# Patient Record
Sex: Female | Born: 1980
Health system: Southern US, Community
[De-identification: ages and names within clinical notes are randomized; demographics above are authoritative.]

## PROBLEM LIST (undated history)

## (undated) DIAGNOSIS — F419 Anxiety disorder, unspecified: Secondary | ICD-10-CM

## (undated) DIAGNOSIS — F329 Major depressive disorder, single episode, unspecified: Secondary | ICD-10-CM

## (undated) DIAGNOSIS — F32A Depression, unspecified: Secondary | ICD-10-CM

## (undated) HISTORY — DX: Anxiety disorder, unspecified: F41.9

## (undated) HISTORY — DX: Major depressive disorder, single episode, unspecified: F32.9

## (undated) HISTORY — DX: Depression, unspecified: F32.A

---

## 2004-07-22 ENCOUNTER — Ambulatory Visit (HOSPITAL_COMMUNITY): Admission: RE | Admit: 2004-07-22 | Discharge: 2004-07-22 | Payer: Self-pay | Admitting: *Deleted

## 2004-12-30 ENCOUNTER — Ambulatory Visit: Payer: Self-pay | Admitting: *Deleted

## 2005-01-02 ENCOUNTER — Ambulatory Visit: Payer: Self-pay | Admitting: *Deleted

## 2005-01-02 ENCOUNTER — Inpatient Hospital Stay (HOSPITAL_COMMUNITY): Admission: AD | Admit: 2005-01-02 | Discharge: 2005-01-04 | Payer: Self-pay | Admitting: *Deleted

## 2006-12-31 ENCOUNTER — Ambulatory Visit (HOSPITAL_COMMUNITY): Admission: RE | Admit: 2006-12-31 | Discharge: 2006-12-31 | Payer: Self-pay | Admitting: Family Medicine

## 2007-05-10 ENCOUNTER — Ambulatory Visit: Payer: Self-pay | Admitting: Family

## 2007-05-10 ENCOUNTER — Inpatient Hospital Stay (HOSPITAL_COMMUNITY): Admission: AD | Admit: 2007-05-10 | Discharge: 2007-05-12 | Payer: Self-pay | Admitting: Obstetrics and Gynecology

## 2008-03-27 ENCOUNTER — Ambulatory Visit (HOSPITAL_COMMUNITY): Admission: RE | Admit: 2008-03-27 | Discharge: 2008-03-27 | Payer: Self-pay | Admitting: Obstetrics & Gynecology

## 2008-06-21 ENCOUNTER — Inpatient Hospital Stay (HOSPITAL_COMMUNITY): Admission: AD | Admit: 2008-06-21 | Discharge: 2008-06-22 | Payer: Self-pay | Admitting: Family Medicine

## 2008-06-21 ENCOUNTER — Ambulatory Visit: Payer: Self-pay | Admitting: Obstetrics and Gynecology

## 2010-11-21 LAB — CBC
HCT: 39.8
MCV: 91.3
RDW: 13.1
WBC: 14.5 — ABNORMAL HIGH

## 2010-11-21 LAB — RPR: RPR Ser Ql: NONREACTIVE

## 2012-11-06 ENCOUNTER — Encounter: Payer: Self-pay | Admitting: Internal Medicine

## 2012-11-06 ENCOUNTER — Ambulatory Visit: Payer: No Typology Code available for payment source | Attending: Internal Medicine | Admitting: Internal Medicine

## 2012-11-06 VITALS — BP 110/65 | HR 78 | Temp 98.7°F | Resp 16 | Ht <= 58 in | Wt 125.0 lb

## 2012-11-06 DIAGNOSIS — K2990 Gastroduodenitis, unspecified, without bleeding: Secondary | ICD-10-CM

## 2012-11-06 DIAGNOSIS — K2970 Gastritis, unspecified, without bleeding: Secondary | ICD-10-CM

## 2012-11-06 DIAGNOSIS — K219 Gastro-esophageal reflux disease without esophagitis: Secondary | ICD-10-CM | POA: Insufficient documentation

## 2012-11-06 LAB — CBC WITH DIFFERENTIAL/PLATELET
Basophils Absolute: 0 10*3/uL (ref 0.0–0.1)
Basophils Relative: 0 % (ref 0–1)
Eosinophils Absolute: 0.1 10*3/uL (ref 0.0–0.7)
Eosinophils Relative: 1 % (ref 0–5)
HCT: 38.6 % (ref 36.0–46.0)
Hemoglobin: 13.5 g/dL (ref 12.0–15.0)
MCHC: 35 g/dL (ref 30.0–36.0)
Neutro Abs: 3.4 10*3/uL (ref 1.7–7.7)
RDW: 13.3 % (ref 11.5–15.5)
WBC: 5.4 10*3/uL (ref 4.0–10.5)

## 2012-11-06 LAB — CMP AND LIVER
Albumin: 4.3 g/dL (ref 3.5–5.2)
Bilirubin, Direct: 0.2 mg/dL (ref 0.0–0.3)
Chloride: 104 mEq/L (ref 96–112)
Indirect Bilirubin: 0.8 mg/dL (ref 0.0–0.9)
Potassium: 4.1 mEq/L (ref 3.5–5.3)
Total Bilirubin: 1 mg/dL (ref 0.3–1.2)
Total Protein: 7.4 g/dL (ref 6.0–8.3)

## 2012-11-06 LAB — LIPID PANEL
Cholesterol: 184 mg/dL (ref 0–200)
LDL Cholesterol: 112 mg/dL — ABNORMAL HIGH (ref 0–99)
Triglycerides: 144 mg/dL (ref <150)
VLDL: 29 mg/dL (ref 0–40)

## 2012-11-06 MED ORDER — ESOMEPRAZOLE MAGNESIUM 40 MG PO CPDR: 40.0000 mg | DELAYED_RELEASE_CAPSULE | Freq: Every day | ORAL | Status: DC

## 2012-11-06 NOTE — Progress Notes (Signed)
PT HERE TO ESTABLISH CARE C/O ACID REFLUX AFTER EATING X 1 MNTH NO MED HX NOTED

## 2012-11-06 NOTE — Patient Instructions (Signed)
Gastritis - Adultos   (Gastritis, Adult)   La gastrittis es la irritación (inflamación) de la membrana interna del estómago. Puede ser una enfermedad de inicio súbito (aguda) o de largo plazo (crónica). Si la gastritis no se trata, puede causar sangrado y úlceras.  CAUSAS   La gastritis se produce cuando la membrana que tapiza interiormente al estómago se debilita o se daña. Los jugos digestivos del estómago inflaman el revestimiento del estómago debilitado. El revestimiento del estómago puede debilitarse o dañarse por una infección viral o bacteriana. La infección bacteriana más común es la infección por Helicobacter pylori. También puede ser el resultado del consumo excesivo de alcohol, por el uso de ciertos medicamentos o porque hay demasiado ácido en el estómago.   SÍNTOMAS   En algunos casos no hay síntomas. Si se presentan síntomas, éstos pueden ser:   · Dolor o sensación de ardor en la parte superior del abdomen.  · Náuseas.  · Vómitos.  · Sensación molesta de distensión después de comer.  DIAGNÓSTICO   El médico puede diagnosticar gastritis según los síntomas y el examen físico. Para determinar la causa de la gastritis, el médico podrá:   · Pedir análisis de sangre o de materia fecal para diagnosticar la presencia de la bacteria H pylori.  · Gastroscopía. Un tubo delgado y flexible (endoscopio) se pasa por el esófago hasta llegar al estómago. El endoscopio tiene una luz y una cámara en el extremo. El médico utilizará el endoscopio para observar el interior del estómago.  · Tomará una muestra de tejido (biopsia) del estómago para examinarlo en el microscopio.  TRATAMIENTO   Según la causa de la gastritis podrán recetarle: Antibióticos, si la causa es una infección bacteriana, como una infección por H. pylori. Antiácidos o bloqueadores H2, si hay demasiado ácido en el estómago. El médico le aconsejará que deje de tomar aspirina, ibuprofeno u otros antiinflamatorios no esteroides (AINE).   INSTRUCCIONES PARA EL  CUIDADO EN EL HOGAR   · Tome sólo medicamentos de venta libre o recetados, según las indicaciones del médico.  · Si le han recetado antibióticos, tómelos según las indicaciones. Tómelos todos, aunque se sienta mejor.  · Debe ingerir gran cantidad de líquido para mantener la orina de tono claro o color amarillo pálido.  · Evite las comidas y bebidas que empeoran los problemas, como:  · Bebidas con cafeína o alcohólicas.  · Chocolate.  · Sabores a menta.  · Ajo y cebolla.  · Comidas muy condimentadas.  · Cítricos como naranjas, limones o limas.  · Alimentos que contengan tomate, como salsas, chile y pizza.  · Alimentos fritos y grasos.  · Haga comidas pequeñas durante el día en lugar de 3 comidas abundantes.  SOLICITE ATENCIÓN MÉDICA DE INMEDIATO SI:   · La materia fecal es negra o de color rojo oscuro.  · Vomita sangre de color rojo brillante o material similar a granos de café.  · No puede retener los líquidos.  · El dolor abdominal empeora.  · Tiene fiebre.  · No mejora luego de 1 semana.  · Tiene preguntas o preocupaciones.  ASEGÚRESE DE QUE:   · Comprende estas instrucciones.  · Controlará su enfermedad.  · Solicitará ayuda de inmediato si no mejora o si empeora.  Document Released: 11/23/2004 Document Revised: 11/08/2011  Exit[employer]® Patient Information ©2014 [employer].

## 2012-11-06 NOTE — Progress Notes (Signed)
Patient ID: Tonda Wiederhold, female   DOB: [birth year], 32 y.o.   MRN: [id number] Patient Demographics  Sheyanne Munley, is a 32 y.o. female  WUJ:[id number]  [id number]  DOB - May 26, 1980  CC:  Chief Complaint  Patient presents with  . Establish Care  . Gastrophageal Reflux       HPI: Brennley Curtice today is here to establish medical care. She is a 32 year old woman with no significant past medical history. Her major concern today is easy satiety. She complains that she feels full very easily after food no matter how little she eats, sometimes she feels full she does not want to eat at all. She has been told before about gastritis but not on any medication. She is married with 4 children, last born is 36 years old. Denies any stressors at home. She does not smoke cigarette, she does not drink alcohol. Lately her menstrual period has also been irregular, but she was on Depo-Provera before and she stopped.  Patient has No headache, No chest pain,- No Nausea or vomiting, No new weakness tingling or numbness, No Cough - SOB.  No Known Allergies History reviewed. No pertinent past medical history. No current outpatient prescriptions on file prior to visit.   No current facility-administered medications on file prior to visit.   Family History  Problem Relation Age of Onset  . Diabetes Mother    History   Social History  . Marital Status: Single    Spouse Name: N/A    Number of Children: N/A  . Years of Education: N/A   Occupational History  . Not on file.   Social History Main Topics  . Smoking status: Never Smoker   . Smokeless tobacco: Not on file  . Alcohol Use: No  . Drug Use: No  . Sexual Activity: Not on file   Other Topics Concern  . Not on file   Social History Narrative  . No narrative on file    Review of Systems: Constitutional: Negative for fever, chills, diaphoresis, activity change, appetite change and fatigue. HENT: Negative for ear pain,  nosebleeds, congestion, facial swelling, rhinorrhea, neck pain, neck stiffness and ear discharge.  Eyes: Negative for pain, discharge, redness, itching and visual disturbance. Respiratory: Negative for cough, choking, chest tightness, shortness of breath, wheezing and stridor.  Cardiovascular: Negative for chest pain, palpitations and leg swelling.. Genitourinary: Negative for dysuria, urgency, frequency, hematuria, flank pain, decreased urine volume, difficulty urinating and dyspareunia.  Musculoskeletal: Negative for back pain, joint swelling, arthralgia and gait problem. Neurological: Negative for dizziness, tremors, seizures, syncope, facial asymmetry, speech difficulty, weakness, light-headedness, numbness and headaches.  Hematological: Negative for adenopathy. Does not bruise/bleed easily. Psychiatric/Behavioral: Negative for hallucinations, behavioral problems, confusion, dysphoric mood, decreased concentration and agitation.    Objective:   Filed Vitals:   11/06/12 1022  BP: 110/65  Pulse: 78  Temp: 98.7 F (37.1 C)  Resp: 16    Physical Exam: Constitutional: Patient appears well-developed and well-nourished. No distress. HENT: Normocephalic, atraumatic, External right and left ear normal. Oropharynx is clear and moist.  Eyes: Conjunctivae and EOM are normal. PERRLA, no scleral icterus. Neck: Normal ROM. Neck supple. No JVD. No tracheal deviation. No thyromegaly. CVS: RRR, S1/S2 +, no murmurs, no gallops, no carotid bruit.  Pulmonary: Effort and breath sounds normal, no stridor, rhonchi, wheezes, rales.  Abdominal: Soft. BS +, no distension, mild epigastric tenderness, no rebound or guarding.  Musculoskeletal: Normal range of motion. No edema and no tenderness.  Lymphadenopathy: No lymphadenopathy noted,  cervical, inguinal or axillary Neuro: Alert. Normal reflexes, muscle tone coordination. No cranial nerve deficit. Skin: Skin is warm and dry. No rash noted. Not diaphoretic.  No erythema. No pallor. Psychiatric: Normal mood and affect. Behavior, judgment, thought content normal.  Lab Results  Component Value Date   WBC 14.5* 05/10/2007   HGB 13.8 05/10/2007   HCT 39.8 05/10/2007   MCV 91.3 05/10/2007   PLT 208 05/10/2007   No results found for this basename: CREATININE, BUN, NA, K, CL, CO2    No results found for this basename: HGBA1C   Lipid Panel  No results found for this basename: chol, trig, hdl, cholhdl, vldl, ldlcalc       Assessment and plan:   There are no active problems to display for this patient.   Plan: Nexium 40 mg capsule by mouth daily Patient extensively counseled about reflux and gastritis Frequent meal small quantity at the time Adequate liquid intake Patient counseled about nutrition and exercise  Labs: CBC D. CMP Lipid panel Urinalysis     Health Maintenance** -Colonoscopy: -Pap Smear: -Mammogram: -Vaccinations:  -TdAP  -PNA (PPSV23) (one dose after 65) (or one dose before 65 if chronic conditions)  -Zoster (1 dose after 60 yrs)  -Influenza  Follow up in 2 months  The patient was given clear instructions to go to ER or return to medical center if symptoms don't improve, worsen or new problems develop. The patient verbalized understanding. The patient was told to call to get lab results if they haven't heard anything in the next week.   Interpreter was used to communicate directly with patient for the entire encounter including providing detailed patient instructions.   Jeanann Lewandowsky, MD, MHA, FACP [facility] [place], [place] [phone]   11/06/2012, 11:05 AM

## 2012-11-07 ENCOUNTER — Telehealth: Payer: Self-pay | Admitting: Emergency Medicine

## 2012-11-07 LAB — URINALYSIS, COMPLETE
Bacteria, UA: NONE SEEN
Casts: NONE SEEN
Hgb urine dipstick: NEGATIVE
Leukocytes, UA: NEGATIVE
Nitrite: NEGATIVE
Specific Gravity, Urine: 1.011 (ref 1.005–1.030)
pH: 7.5 (ref 5.0–8.0)

## 2012-11-07 NOTE — Telephone Encounter (Signed)
Message copied by Darlis Loan on Thu Nov 07, 2012  4:40 PM ------      Message from: Jeanann Lewandowsky E      Created: Thu Nov 07, 2012  9:32 AM       Please call to inform patient that most of the lab results come back normal. Cholesterol is slightly high but can be corrected with nutrition control and exercise ------

## 2012-11-07 NOTE — Telephone Encounter (Signed)
LAB RESULTS GIVEN  

## 2013-01-06 ENCOUNTER — Ambulatory Visit: Payer: Self-pay

## 2013-02-07 ENCOUNTER — Emergency Department (HOSPITAL_COMMUNITY)
Admission: EM | Admit: 2013-02-07 | Discharge: 2013-02-08 | Disposition: A | Discharge status: Home / Self Care | Payer: No Typology Code available for payment source | Attending: Emergency Medicine | Admitting: Emergency Medicine

## 2013-02-07 ENCOUNTER — Encounter (HOSPITAL_COMMUNITY): Payer: Self-pay | Admitting: Emergency Medicine

## 2013-02-07 DIAGNOSIS — F419 Anxiety disorder, unspecified: Secondary | ICD-10-CM

## 2013-02-07 DIAGNOSIS — F411 Generalized anxiety disorder: Secondary | ICD-10-CM | POA: Insufficient documentation

## 2013-02-07 DIAGNOSIS — Z79899 Other long term (current) drug therapy: Secondary | ICD-10-CM | POA: Insufficient documentation

## 2013-02-07 NOTE — ED Provider Notes (Signed)
CSN: [id number]     Arrival date & time 02/07/13  2248 History   First MD Initiated Contact with Patient 02/07/13 2322     Chief Complaint  Patient presents with  . Hypertension    HPI  History provided by the patient through a Spanish interpreter. Patient is a 32 year old female with no significant PMH presenting with concerns for general discomfort and elevated blood pressure. Patient states that she has had some recent episodes of feeling "desperate". Symptoms are associated with slight lightheadedness feeling and feeling uneasy. She does report having increased stress. No prior history of anxiety or panic attacks. Denies any hyperventilation or shortness of breath during these episodes. She did have a friend who is nursing a took her blood pressure and reported that it was high at 125 systolic. Patient was generally concerned and wanted to be sure nothing serious was going on. She does feel better currently. Episodes have been lasting 10 minutes or less. Denies any associated heart palpitations or chest pain. No recent travel. No swelling or pain in the extremities. No previous history of DVT or PE. No cough or hemoptysis.    History reviewed. No pertinent past medical history. History reviewed. No pertinent past surgical history. Family History  Problem Relation Age of Onset  . Diabetes Mother    History  Substance Use Topics  . Smoking status: Never Smoker   . Smokeless tobacco: Not on file  . Alcohol Use: Yes   OB History   Grav Para Term Preterm Abortions TAB SAB Ect Mult Living                 Review of Systems  Constitutional: Negative for fever and chills.  Respiratory: Negative for cough and shortness of breath.   Cardiovascular: Negative for chest pain, palpitations and leg swelling.  All other systems reviewed and are negative.    Allergies  Review of patient's allergies indicates no known allergies.  Home Medications   Current Outpatient Rx  Name  Route   Sig  Dispense  Refill  . esomeprazole (NEXIUM) 40 MG capsule   Oral   Take 1 capsule (40 mg total) by mouth daily.   30 capsule   3    BP 120/72  Pulse 84  Temp(Src) 98.2 F (36.8 C) (Oral)  Resp 16  Wt 128 lb (58.06 kg)  SpO2 98%  LMP 01/16/2013 Physical Exam  Nursing note and vitals reviewed. Constitutional: She is oriented to person, place, and time. She appears well-developed and well-nourished. No distress.  HENT:  Head: Normocephalic.  Eyes: Conjunctivae are normal.  Neck: Normal range of motion. Neck supple.  Cardiovascular: Normal rate and regular rhythm.   No murmur heard. Pulmonary/Chest: Effort normal and breath sounds normal. No respiratory distress.  Abdominal: Soft.  Musculoskeletal: Normal range of motion. She exhibits no edema and no tenderness.  Neurological: She is alert and oriented to person, place, and time.  Skin: Skin is warm and dry. No rash noted.  Psychiatric: She has a normal mood and affect. Her behavior is normal.    ED Course  Procedures   DIAGNOSTIC STUDIES: Oxygen Saturation is 98% on room air.    COORDINATION OF CARE:  Nursing notes reviewed. Vital signs reviewed. Initial pt interview and examination performed.   11:59 PM- patient seen and evaluated. Patient well appearing. Does not appear in any acute distress. Normal respirations. Pt is PERC negative. At this time doubt any concerning or emergent condition causing patient's symptoms. She is  not significantly hypertensive. Patient instructed to followup with her primary care provider for continued evaluation and treatment. Pt agrees with plan.    MDM   1. Anxiety        Angus Seller, PA-C 02/08/13 2155

## 2013-02-07 NOTE — ED Notes (Signed)
Pt reports elevated BP at home (125/?).  Denies any complaints.  Asked pt if she meant her HR and she said no that her BP was elevated at 5pm when she checked it at home.

## 2013-02-09 NOTE — ED Provider Notes (Signed)
Medical screening examination/treatment/procedure(s) were performed by non-physician practitioner and as supervising physician I was immediately available for consultation/collaboration.    Mattias Walmsley, MD 02/09/13 0102 

## 2013-03-04 ENCOUNTER — Encounter: Payer: Self-pay | Admitting: Internal Medicine

## 2013-03-04 ENCOUNTER — Ambulatory Visit: Payer: No Typology Code available for payment source | Attending: Internal Medicine | Admitting: Internal Medicine

## 2013-03-04 VITALS — BP 111/71 | HR 88 | Temp 97.9°F | Resp 16 | Wt 128.0 lb

## 2013-03-04 DIAGNOSIS — F419 Anxiety disorder, unspecified: Secondary | ICD-10-CM

## 2013-03-04 DIAGNOSIS — F329 Major depressive disorder, single episode, unspecified: Secondary | ICD-10-CM | POA: Insufficient documentation

## 2013-03-04 DIAGNOSIS — F341 Dysthymic disorder: Secondary | ICD-10-CM

## 2013-03-04 DIAGNOSIS — F411 Generalized anxiety disorder: Secondary | ICD-10-CM | POA: Insufficient documentation

## 2013-03-04 DIAGNOSIS — F3289 Other specified depressive episodes: Secondary | ICD-10-CM | POA: Insufficient documentation

## 2013-03-04 MED ORDER — SERTRALINE HCL 25 MG PO TABS: 25.0000 mg | ORAL_TABLET | Freq: Every day | ORAL | Status: DC

## 2013-03-04 NOTE — Progress Notes (Signed)
MRN: [id number] Name: Amy Tyler  Sex: female Age: 33 y.o. DOB: [birth year]  Allergies: Review of patient's allergies indicates no known allergies.  Chief Complaint  Patient presents with  . Medication Refill    HPI: Patient is 33 y.o. female who comes today reported to have symptoms of anxiety/depression, she brought the bottle of medication lorazepam which was prescribed to her by a physician, she reports change in sleep appetite, I have reviewed her previous blood work denies any SI or HI.  No past medical history on file.  No past surgical history on file.    Medication List       This list is accurate as of: 03/04/13  9:37 AM.  Always use your most recent med list.               esomeprazole 40 MG capsule  Commonly known as:  NEXIUM  Take 1 capsule (40 mg total) by mouth daily.     sertraline 25 MG tablet  Commonly known as:  ZOLOFT  Take 1 tablet (25 mg total) by mouth daily.        Meds ordered this encounter  Medications  . sertraline (ZOLOFT) 25 MG tablet    Sig: Take 1 tablet (25 mg total) by mouth daily.    Dispense:  30 tablet    Refill:  3     There is no immunization history on file for this patient.  Family History  Problem Relation Age of Onset  . Diabetes Mother     History  Substance Use Topics  . Smoking status: Never Smoker   . Smokeless tobacco: Not on file  . Alcohol Use: Yes    Review of Systems  As noted in HPI  Filed Vitals:   03/04/13 0926  BP: 111/71  Pulse: 88  Temp: 97.9 F (36.6 C)  Resp: 16    Physical Exam  Physical Exam  Constitutional: No distress.  Eyes: EOM are normal. Pupils are equal, round, and reactive to light.  Cardiovascular: Normal rate and regular rhythm.   Pulmonary/Chest: Breath sounds normal. No respiratory distress. She has no wheezes. She has no rales.    CBC    Component Value Date/Time   WBC 5.4 11/06/2012 1105   RBC 4.46 11/06/2012 1105   HGB 13.5 11/06/2012 1105   HCT 38.6  11/06/2012 1105   PLT 299 11/06/2012 1105   MCV 86.5 11/06/2012 1105   LYMPHSABS 1.6 11/06/2012 1105   MONOABS 0.3 11/06/2012 1105   EOSABS 0.1 11/06/2012 1105   BASOSABS 0.0 11/06/2012 1105    CMP     Component Value Date/Time   NA 138 11/06/2012 1105   K 4.1 11/06/2012 1105   CL 104 11/06/2012 1105   CO2 27 11/06/2012 1105   GLUCOSE 82 11/06/2012 1105   BUN 8 11/06/2012 1105   CREATININE 0.54 11/06/2012 1105   CALCIUM 9.6 11/06/2012 1105   PROT 7.4 11/06/2012 1105   ALBUMIN 4.3 11/06/2012 1105   AST 21 11/06/2012 1105   ALT 30 11/06/2012 1105   ALKPHOS 70 11/06/2012 1105   BILITOT 1.0 11/06/2012 1105    Lab Results  Component Value Date/Time   CHOL 184 11/06/2012 11:05 AM    No components found with this basename: hga1c    Lab Results  Component Value Date/Time   AST 21 11/06/2012 11:05 AM    Assessment and Plan  Anxiety and depression - Plan: I have started patient on  sertraline (ZOLOFT) 25 MG tablet  daily, she'll follow up in 2 months.  Return in about 2 months (around 05/02/2013).  Doris CheadleADVANI, Eisley Barber, MD

## 2013-03-04 NOTE — Progress Notes (Signed)
Patient here for medication refill.

## 2013-03-11 ENCOUNTER — Ambulatory Visit: Payer: Self-pay

## 2013-03-11 ENCOUNTER — Telehealth: Payer: Self-pay | Admitting: Internal Medicine

## 2013-03-11 NOTE — Telephone Encounter (Signed)
Pt. Walked in and has an appointment on 04/07/13. Pt is experiencing pain in back of her head, pt.  would like to receive call with advice as to what to do prior to her appt. Please call patient.

## 2013-03-14 ENCOUNTER — Encounter: Payer: Self-pay | Admitting: Internal Medicine

## 2013-03-14 ENCOUNTER — Ambulatory Visit: Payer: No Typology Code available for payment source | Attending: Internal Medicine | Admitting: Internal Medicine

## 2013-03-14 VITALS — BP 101/63 | HR 71 | Temp 98.7°F | Resp 14 | Ht 60.0 in | Wt 125.8 lb

## 2013-03-14 DIAGNOSIS — F3289 Other specified depressive episodes: Secondary | ICD-10-CM | POA: Insufficient documentation

## 2013-03-14 DIAGNOSIS — F411 Generalized anxiety disorder: Secondary | ICD-10-CM | POA: Insufficient documentation

## 2013-03-14 DIAGNOSIS — F329 Major depressive disorder, single episode, unspecified: Secondary | ICD-10-CM | POA: Insufficient documentation

## 2013-03-14 DIAGNOSIS — R51 Headache: Secondary | ICD-10-CM

## 2013-03-14 LAB — POCT URINE PREGNANCY: PREG TEST UR: NEGATIVE

## 2013-03-14 NOTE — Progress Notes (Signed)
Pt is here for a f/u for a headache pain. Had a recent possible fever and headache pain x1 year. Feels a lot of heat from forehead. Symptoms have been happening over a period of time on and off. Pt is using the interpreter line.

## 2013-03-14 NOTE — Progress Notes (Signed)
Patient ID: Amy PittsMaricela Tyler, female   DOB: 05/16/1980, 33 y.o.   MRN: [id number]   CC:  HPI: 33 year old female with a history of anxiety and depression on lorazepam and Zoloft comes in with chief complaint of warm feeling in the back of her scalp, in the occipital region, and a warm feeling that she's had for 1-1/2 years. The patient denies any blurry vision denies any nausea vomiting, denies any morning headaches. She also states that she has not been sleeping well, and is full of energy and does not get tired even if she does not sleep  She denies any homicidal or suicidal ideation   No Known Allergies No past medical history on file. Current Outpatient Prescriptions on File Prior to Visit  Medication Sig Dispense Refill  . sertraline (ZOLOFT) 25 MG tablet Take 1 tablet (25 mg total) by mouth daily.  30 tablet  3  . esomeprazole (NEXIUM) 40 MG capsule Take 1 capsule (40 mg total) by mouth daily.  30 capsule  3   No current facility-administered medications on file prior to visit.   Family History  Problem Relation Age of Onset  . Diabetes Mother    History   Social History  . Marital Status: Single    Spouse Name: N/A    Number of Children: N/A  . Years of Education: N/A   Occupational History  . Not on file.   Social History Main Topics  . Smoking status: Never Smoker   . Smokeless tobacco: Not on file  . Alcohol Use: Yes  . Drug Use: No  . Sexual Activity: Not on file   Other Topics Concern  . Not on file   Social History Narrative  . No narrative on file    Review of Systems  Constitutional: Negative for fever, chills, diaphoresis, activity change, appetite change and fatigue.  HENT: Negative for ear pain, nosebleeds, congestion, facial swelling, rhinorrhea, neck pain, neck stiffness and ear discharge.   Eyes: Negative for pain, discharge, redness, itching and visual disturbance.  Respiratory: Negative for cough, choking, chest tightness, shortness of  breath, wheezing and stridor.   Cardiovascular: Negative for chest pain, palpitations and leg swelling.  Gastrointestinal: Negative for abdominal distention.  Genitourinary: Negative for dysuria, urgency, frequency, hematuria, flank pain, decreased urine volume, difficulty urinating and dyspareunia.  Musculoskeletal: Negative for back pain, joint swelling, arthralgias and gait problem.  Neurological: Negative for dizziness, tremors, seizures, syncope, facial asymmetry, speech difficulty, weakness, light-headedness, numbness and headaches.  Hematological: Negative for adenopathy. Does not bruise/bleed easily.  Psychiatric/Behavioral: Negative for hallucinations, behavioral problems, confusion, dysphoric mood, decreased concentration and agitation.    Objective:   Filed Vitals:   03/14/13 0956  BP: 101/63  Pulse: 71  Temp: 98.7 F (37.1 C)  Resp: 14    Physical Exam  Constitutional: Appears well-developed and well-nourished. No distress.  HENT: Normocephalic. External right and left ear normal. Oropharynx is clear and moist.  Eyes: Conjunctivae and EOM are normal. PERRLA, no scleral icterus.  Neck: Normal ROM. Neck supple. No JVD. No tracheal deviation. No thyromegaly.  CVS: RRR, S1/S2 +, no murmurs, no gallops, no carotid bruit.  Pulmonary: Effort and breath sounds normal, no stridor, rhonchi, wheezes, rales.  Abdominal: Soft. BS +,  no distension, tenderness, rebound or guarding.  Musculoskeletal: Normal range of motion. No edema and no tenderness.  Lymphadenopathy: No lymphadenopathy noted, cervical, inguinal. Neuro: As in history of present illness Skin: Skin is warm and dry. No rash noted. Not diaphoretic. No  erythema. No pallor.  Psychiatric: As in history of present illness   Lab Results  Component Value Date   WBC 5.4 11/06/2012   HGB 13.5 11/06/2012   HCT 38.6 11/06/2012   MCV 86.5 11/06/2012   PLT 299 11/06/2012   Lab Results  Component Value Date   CREATININE 0.54  11/06/2012   BUN 8 11/06/2012   NA 138 11/06/2012   K 4.1 11/06/2012   CL 104 11/06/2012   CO2 27 11/06/2012    No results found for this basename: HGBA1C   Lipid Panel     Component Value Date/Time   CHOL 184 11/06/2012 1105   TRIG 144 11/06/2012 1105   HDL 43 11/06/2012 1105   CHOLHDL 4.3 11/06/2012 1105   VLDL 29 11/06/2012 1105   LDLCALC 112* 11/06/2012 1105       Assessment and plan:   Patient Active Problem List   Diagnosis Date Noted  . Anxiety and depression 03/04/2013    Warm feeling at the back of her scalp, no headache Patient feels that there is something inside her brain She does not report any red flag for an intracranial lesion to rule out mass lesion we'll order an MRI of the brain  This could be secondary to insomnia as well Given her history of depression, anxiety, insomnia the patient has been referred to a psychiatrist I have advised her that if the patient develops a headache she should take ibuprofen over-the-counter  If the MRI is negative the patient will need psychotherapy as well as psychiatric consultations to sort this out  Followup in 1 month   The patient was given clear instructions to go to ER or return to medical center if symptoms don't improve, worsen or new problems develop. The patient verbalized understanding. The patient was told to call to get any lab results if not heard anything in the next week.

## 2013-03-15 LAB — PROLACTIN: Prolactin: 10 ng/mL

## 2013-03-15 LAB — TSH: TSH: 0.375 u[IU]/mL (ref 0.350–4.500)

## 2013-03-17 ENCOUNTER — Telehealth: Payer: Self-pay | Admitting: *Deleted

## 2013-03-17 NOTE — Telephone Encounter (Signed)
Left a voicemail for pt to give us a call back. Used the interpreter line to contact pt.

## 2013-03-17 NOTE — Telephone Encounter (Signed)
Message copied by Gordan Grell, [place] on Mon Mar 17, 2013  2:31 PM ------      Message from: Susie CassetteABROL MD, [facility]      Created: Mon Mar 17, 2013 10:51 AM       Notify patient that labs are normal. Please encourage patient to complete her MRI of the brain before next visit ------

## 2013-03-26 ENCOUNTER — Telehealth: Payer: Self-pay | Admitting: *Deleted

## 2013-03-26 ENCOUNTER — Ambulatory Visit (HOSPITAL_COMMUNITY)
Admission: RE | Admit: 2013-03-26 | Discharge: 2013-03-26 | Disposition: A | Discharge status: Home / Self Care | Payer: No Typology Code available for payment source | Source: Ambulatory Visit | Attending: Internal Medicine | Admitting: Internal Medicine

## 2013-03-26 DIAGNOSIS — R51 Headache: Secondary | ICD-10-CM

## 2013-03-26 NOTE — Telephone Encounter (Signed)
Left a voicemail for pt to give us a call back. 

## 2013-03-26 NOTE — Telephone Encounter (Signed)
Message copied by Braelynne Garinger, [place] on Wed Mar 26, 2013 11:13 AM ------      Message from: Susie CassetteABROL MD, [facility]      Created: Wed Mar 26, 2013 10:24 AM       Patient the patient has a normal MRI ------

## 2013-03-27 ENCOUNTER — Telehealth: Payer: Self-pay | Admitting: *Deleted

## 2013-03-27 NOTE — Telephone Encounter (Signed)
Contacted pt to notify her that her MRI results were normal.

## 2013-04-07 ENCOUNTER — Ambulatory Visit: Payer: Self-pay | Admitting: Internal Medicine

## 2013-04-15 ENCOUNTER — Ambulatory Visit: Payer: Self-pay | Admitting: Internal Medicine

## 2013-05-02 ENCOUNTER — Ambulatory Visit: Payer: No Typology Code available for payment source | Attending: Internal Medicine | Admitting: Internal Medicine

## 2013-05-02 ENCOUNTER — Encounter: Payer: Self-pay | Admitting: Internal Medicine

## 2013-05-02 ENCOUNTER — Encounter (INDEPENDENT_AMBULATORY_CARE_PROVIDER_SITE_OTHER): Payer: Self-pay

## 2013-05-02 VITALS — BP 102/64 | HR 74 | Temp 98.1°F | Resp 16

## 2013-05-02 DIAGNOSIS — F3289 Other specified depressive episodes: Secondary | ICD-10-CM | POA: Insufficient documentation

## 2013-05-02 DIAGNOSIS — F329 Major depressive disorder, single episode, unspecified: Secondary | ICD-10-CM

## 2013-05-02 DIAGNOSIS — F411 Generalized anxiety disorder: Secondary | ICD-10-CM | POA: Insufficient documentation

## 2013-05-02 DIAGNOSIS — F341 Dysthymic disorder: Secondary | ICD-10-CM

## 2013-05-02 DIAGNOSIS — Z09 Encounter for follow-up examination after completed treatment for conditions other than malignant neoplasm: Secondary | ICD-10-CM | POA: Insufficient documentation

## 2013-05-02 DIAGNOSIS — F419 Anxiety disorder, unspecified: Secondary | ICD-10-CM

## 2013-05-02 MED ORDER — SERTRALINE HCL 25 MG PO TABS: 25.0000 mg | ORAL_TABLET | Freq: Every day | ORAL | Status: DC

## 2013-05-02 NOTE — Progress Notes (Signed)
Patient here for follow up on her depression

## 2013-05-02 NOTE — Progress Notes (Signed)
MRN: [id number] Name: Amy Tyler  Sex: female Age: 33 y.o. DOB: 11/15/1980  Allergies: Review of patient's allergies indicates no known allergies.  Chief Complaint  Patient presents with  . Follow-up    HPI: Patient is 33 y.o. female who has history of anxiety/depression comes today for followup, on the last visit she was started on Zoloft 25 mg, patient reports improvement in the symptoms, denies any SI or HI, denies any chest pain or shortness of breath.  History reviewed. No pertinent past medical history.  History reviewed. No pertinent past surgical history.    Medication List       This list is accurate as of: 05/02/13 10:32 AM.  Always use your most recent med list.               esomeprazole 40 MG capsule  Commonly known as:  NEXIUM  Take 1 capsule (40 mg total) by mouth daily.     LORazepam 0.5 MG tablet  Commonly known as:  ATIVAN  Take 0.5 mg by mouth every 8 (eight) hours.     sertraline 25 MG tablet  Commonly known as:  ZOLOFT  Take 1 tablet (25 mg total) by mouth daily.        Meds ordered this encounter  Medications  . sertraline (ZOLOFT) 25 MG tablet    Sig: Take 1 tablet (25 mg total) by mouth daily.    Dispense:  30 tablet    Refill:  3     There is no immunization history on file for this patient.  Family History  Problem Relation Age of Onset  . Diabetes Mother     History  Substance Use Topics  . Smoking status: Never Smoker   . Smokeless tobacco: Not on file  . Alcohol Use: Yes    Review of Systems   As noted in HPI  Filed Vitals:   05/02/13 1005  BP: 102/64  Pulse: 74  Temp: 98.1 F (36.7 C)  Resp: 16    Physical Exam  Physical Exam  Constitutional: No distress.  Eyes: EOM are normal. Pupils are equal, round, and reactive to light.  Cardiovascular: Normal rate and regular rhythm.   Pulmonary/Chest: Breath sounds normal. No respiratory distress. She has no wheezes. She has no rales.    Musculoskeletal: She exhibits no edema.    CBC    Component Value Date/Time   WBC 5.4 11/06/2012 1105   RBC 4.46 11/06/2012 1105   HGB 13.5 11/06/2012 1105   HCT 38.6 11/06/2012 1105   PLT 299 11/06/2012 1105   MCV 86.5 11/06/2012 1105   LYMPHSABS 1.6 11/06/2012 1105   MONOABS 0.3 11/06/2012 1105   EOSABS 0.1 11/06/2012 1105   BASOSABS 0.0 11/06/2012 1105    CMP     Component Value Date/Time   NA 138 11/06/2012 1105   K 4.1 11/06/2012 1105   CL 104 11/06/2012 1105   CO2 27 11/06/2012 1105   GLUCOSE 82 11/06/2012 1105   BUN 8 11/06/2012 1105   CREATININE 0.54 11/06/2012 1105   CALCIUM 9.6 11/06/2012 1105   PROT 7.4 11/06/2012 1105   ALBUMIN 4.3 11/06/2012 1105   AST 21 11/06/2012 1105   ALT 30 11/06/2012 1105   ALKPHOS 70 11/06/2012 1105   BILITOT 1.0 11/06/2012 1105    Lab Results  Component Value Date/Time   CHOL 184 11/06/2012 11:05 AM    No components found with this basename: hga1c    Lab Results  Component Value  Date/Time   AST 21 11/06/2012 11:05 AM    Assessment and Plan  Anxiety and depression - Plan: Continue with her sertraline (ZOLOFT) 25 MG tablet, she'll follow up in 2 months, the symptoms are improved but not yet baseline, I have discussed with patient we can increase the dose of the medication, patient would like to continue with this at this point.   Health Maintenance  -Pap Smear: Schedule with Dr. Joseph ArtWoods    Return in about 2 months (around 07/02/2013) for depression, anxiety.  Doris CheadleADVANI, Oluwadarasimi Redmon, MD

## 2013-05-22 ENCOUNTER — Other Ambulatory Visit (HOSPITAL_COMMUNITY)
Admission: RE | Admit: 2013-05-22 | Discharge: 2013-05-22 | Disposition: A | Discharge status: Home / Self Care | Payer: No Typology Code available for payment source | Source: Ambulatory Visit | Attending: Family Medicine | Admitting: Family Medicine

## 2013-05-22 ENCOUNTER — Ambulatory Visit: Payer: No Typology Code available for payment source | Attending: Family Medicine | Admitting: Family Medicine

## 2013-05-22 ENCOUNTER — Encounter: Payer: Self-pay | Admitting: Family Medicine

## 2013-05-22 VITALS — BP 118/78 | HR 81 | Temp 98.1°F | Resp 16 | Ht 59.0 in | Wt 125.0 lb

## 2013-05-22 DIAGNOSIS — N898 Other specified noninflammatory disorders of vagina: Secondary | ICD-10-CM

## 2013-05-22 DIAGNOSIS — N760 Acute vaginitis: Secondary | ICD-10-CM | POA: Insufficient documentation

## 2013-05-22 DIAGNOSIS — Z124 Encounter for screening for malignant neoplasm of cervix: Secondary | ICD-10-CM

## 2013-05-22 DIAGNOSIS — F329 Major depressive disorder, single episode, unspecified: Secondary | ICD-10-CM

## 2013-05-22 DIAGNOSIS — F419 Anxiety disorder, unspecified: Secondary | ICD-10-CM

## 2013-05-22 DIAGNOSIS — F3289 Other specified depressive episodes: Secondary | ICD-10-CM | POA: Insufficient documentation

## 2013-05-22 DIAGNOSIS — Z01419 Encounter for gynecological examination (general) (routine) without abnormal findings: Secondary | ICD-10-CM | POA: Insufficient documentation

## 2013-05-22 DIAGNOSIS — Z1151 Encounter for screening for human papillomavirus (HPV): Secondary | ICD-10-CM | POA: Insufficient documentation

## 2013-05-22 DIAGNOSIS — F341 Dysthymic disorder: Secondary | ICD-10-CM

## 2013-05-22 MED ORDER — SERTRALINE HCL 50 MG PO TABS: 25.0000 mg | ORAL_TABLET | Freq: Every day | ORAL | Status: DC

## 2013-05-22 NOTE — Progress Notes (Signed)
Pt here for pap smear. Normal pap x 3 yrs ago Need medication Zoloft increased. States not effective C/o white vag d/c with odor. Slight back pain Denies n/v/ or fevers LMP- last week Spanish interpretor present

## 2013-05-22 NOTE — Patient Instructions (Signed)
Prueba de Papanicolaou  (Pap Test)  La prueba de Papanicolaou es un procedimiento realizado en una clnica para evaluar las clulas que estn en la superficie del cuello uterino. El cuello uterino se encuentra entre la parte inferior del tero y la parte superior de la vagina. En algunas mujeres, la regin del cuello del tero tiene el potencial de formar clulas cancerosas. Con evaluaciones constantes por su mdico, este tipo de cncer se puede prevenir.  Si una prueba de Papanicolaou es anormal, es generalmente el resultado de una exposicin previa al virus del papiloma humano (VPH). El VPH es un virus que puede infectar las clulas del cuello uterino y la displasia causa. La displasia es cuando las clulas no se ven normales. Si una mujer ha sido diagnosticada con displasia de alto grado o severa, est en mayor riesgo de desarrollar cncer cervical. Las personas diagnosticadas con displasia de bajo grado deben controlarse con su mdico porque hay una pequea probabilidad de que displasias de bajo grado puedan convertirse en cncer.  INFORME A SU MDICO SOBRE:   Si ha tenido una reciente infeccin de transmisin sexual (ITS).  Las nuevas parejas sexuales que ha tenido.  La historia de resultados de las anteriores pruebas de Papanicolaou anormales.  La historia de los procedimientos cervicales anteriores que haya tenido (colposcopia, biopsia, procedimiento de excisin electroquirrgica [LEEP]).  Las preocupaciones que haya tenido respecto a secreciones vaginales inusuales.  Antecedentes de dolor plvico.  El uso de mtodos anticonceptivos. ANTES DEL PROCEDIMIENTO   Pregntele a su mdico cundo programar la prueba. Es mejor hacerla cuando no est en su perodo si el mdico usa una esptula de madera para recoger clulas o coloca las clulas en un portaobjetos de vidrio. Las tcnicas no son tan sensibles durante el ciclo menstrual.  No use duchas vaginales ni tenga relaciones sexuales durante  las 24 horas anteriores al procedimiento.   No use cremas vaginales o tampones durante las 24 horas antes de la prueba.   Vace su vejiga justo antes del procedimiento para disminuir las molestias.  PROCEDIMIENTO  Deber acostarse en una camilla con los pies en los estribos. Se colocar un instrumento tibio de metal o plstico (espculo) en la vagina. Este instrumento permite al mdico ver el interior de la vagina y observar el cuello del tero. Luego se usa un cepillo de plstico pequeo, o una esptula de madera para recoger las clulas. Esas clulas se colocan en un recipiente para ser examinadas en el laboratorio. Las clulas se estudian en el microscopio. Un especialista determinar si las clulas son normales.  DESPUS DEL PROCEDIMIENTO  Asegrese de obtener los resultados.Si los resultados son anormales, puede ser necesario que se haga nuevos estudios.  Document Released: 08/02/2007 Document Revised: 05/08/2011 [employer] Patient Information 2014 [employer].  

## 2013-05-22 NOTE — Progress Notes (Signed)
   Subjective:    Patient ID: Amy Tyler, female    DOB: Sep 08, 1980, 33 y.o.   MRN: [id number]  Gynecologic Exam   Patient is here for followup on depression. She was started on Zoloft 25 mg per day. She feels like it has helped but doesn't last throughout the whole day. She denies any adverse side effects. She denies suicidal ideation or homicidal ideation. She says that her mood has improved.  She complains of creamy white vaginal discharge with odor. She denies any pelvic pain or abnormal bleeding.  She requests Pap smear today. Her last Pap was 3 years ago and was normal. She denies history of abnormal Pap smear   Review of Systems A 12 point review of systems is negative except as per hpi.       Objective:   Physical Exam  Genitourinary: Vagina normal and uterus normal. There is no rash, tenderness or lesion on the right labia. There is no rash, tenderness or lesion on the left labia. Cervix exhibits discharge and friability. Cervix exhibits no motion tenderness. Right adnexum displays no mass, no tenderness and no fullness. Left adnexum displays no mass, no tenderness and no fullness.   Nursing note and vitals reviewed. Constitutional: She is oriented to person, place, and time. She appears well-developed and well-nourished.  Neck: Normal range of motion. Neck supple. No thyromegaly present.  Cardiovascular: Normal rate, regular rhythm and normal heart sounds.   Pulmonary/Chest: Effort normal and breath sounds normal.  Abdominal: Soft. Bowel sounds are normal. She exhibits no distension. There is no tenderness.  Psychiatric: She has a normal mood and affect. Her behavior is normal.         Assessment & Plan:  Sharlee BlewMaricela was seen today for follow-up, gynecologic exam and medication refill.  Diagnoses and associated orders for this visit:  Anxiety and depression - sertraline (ZOLOFT) 50 MG tablet; Take 0.5 tablets (25 mg total) by mouth daily.  Vaginal  discharge - Cytology - PAP [place] Wet prep ordered. KOH ordered. Screening for cervical cancer - Cytology - PAP [place] Cervix was slightly friable on exam. Otherwise exam was within normal limits.  Followup 4-6 weeks, earlier if needed. She should go to the emergency department with severe acute issues.

## 2013-05-28 LAB — CERVICOVAGINAL ANCILLARY ONLY
Bacterial vaginitis: NEGATIVE
CANDIDA VAGINITIS: NEGATIVE

## 2013-05-29 ENCOUNTER — Telehealth: Payer: Self-pay

## 2013-05-29 NOTE — Telephone Encounter (Signed)
Message copied by Lestine MountJUAREZ, David Rodriquez L on Thu May 29, 2013  2:53 PM ------      Message from: Acey LavWOOD, ALLISON L      Created: Wed May 28, 2013  8:21 PM       Labs wnl ------

## 2013-05-29 NOTE — Telephone Encounter (Signed)
Interpreter line used Message left that pap was normal

## 2013-06-19 ENCOUNTER — Ambulatory Visit: Payer: No Typology Code available for payment source | Admitting: Internal Medicine

## 2013-07-28 ENCOUNTER — Encounter: Payer: Self-pay | Admitting: Internal Medicine

## 2013-07-28 ENCOUNTER — Ambulatory Visit: Payer: No Typology Code available for payment source | Attending: Internal Medicine | Admitting: Internal Medicine

## 2013-07-28 VITALS — BP 107/67 | HR 75 | Temp 98.7°F | Resp 16 | Ht 59.0 in | Wt 126.0 lb

## 2013-07-28 DIAGNOSIS — F329 Major depressive disorder, single episode, unspecified: Secondary | ICD-10-CM | POA: Insufficient documentation

## 2013-07-28 DIAGNOSIS — F411 Generalized anxiety disorder: Secondary | ICD-10-CM | POA: Insufficient documentation

## 2013-07-28 DIAGNOSIS — F419 Anxiety disorder, unspecified: Secondary | ICD-10-CM

## 2013-07-28 DIAGNOSIS — K0889 Other specified disorders of teeth and supporting structures: Secondary | ICD-10-CM

## 2013-07-28 DIAGNOSIS — F341 Dysthymic disorder: Secondary | ICD-10-CM

## 2013-07-28 DIAGNOSIS — K089 Disorder of teeth and supporting structures, unspecified: Secondary | ICD-10-CM

## 2013-07-28 DIAGNOSIS — F3289 Other specified depressive episodes: Secondary | ICD-10-CM | POA: Insufficient documentation

## 2013-07-28 NOTE — Progress Notes (Signed)
Patient ID: Amy Tyler, female   DOB: 09/30/1980, 33 y.o.   MRN: [id number]  CC: f/u anxiety and depression  HPI: Currently taking zoloft 50 mg daily since March.  Improved mood since January. Patietn occasionally feels sad.  Oldest daughter was diagnosed with autism and younger daughter was diagnosed with thyroid disorder recently.  Husband works a lot and she does not have family here to help care for her small children.  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  She reports that she gets sad thinking about her daughters health. She reports feelings of weakness and fatigue.     No Known Allergies Past Medical History  Diagnosis Date  . Anxiety   . Depression    Current Outpatient Prescriptions on File Prior to Visit  Medication Sig Dispense Refill  . sertraline (ZOLOFT) 50 MG tablet Take 0.5 tablets (25 mg total) by mouth daily.  30 tablet  3  . esomeprazole (NEXIUM) 40 MG capsule Take 1 capsule (40 mg total) by mouth daily.  30 capsule  3  . LORazepam (ATIVAN) 0.5 MG tablet Take 0.5 mg by mouth every 8 (eight) hours.       No current facility-administered medications on file prior to visit.   Family History  Problem Relation Age of Onset  . Diabetes Mother    History   Social History  . Marital Status: Married    Spouse Name: N/A    Number of Children: N/A  . Years of Education: N/A   Occupational History  . Not on file.   Social History Main Topics  . Smoking status: Never Smoker   . Smokeless tobacco: Not on file  . Alcohol Use: Yes  . Drug Use: No  . Sexual Activity: Not on file   Other Topics Concern  . Not on file   Social History Narrative  . No narrative on file    Review of Systems: See HPI  Objective:   Filed Vitals:   07/28/13 1203  BP: 107/67  Pulse: 75  Temp: 98.7 F (37.1 C)  Resp: 16   Physical Exam  Vitals reviewed. Constitutional: She is oriented to  person, place, and time. She appears well-nourished.  Cardiovascular: Normal rate, regular rhythm and normal heart sounds.   Pulmonary/Chest: Effort normal and breath sounds normal.  Abdominal: Soft. Bowel sounds are normal.  Neurological: She is alert and oriented to person, place, and time.  Skin: Skin is warm and dry.  Psychiatric: Her behavior is normal. Thought content normal.     Lab Results  Component Value Date   WBC 5.4 11/06/2012   HGB 13.5 11/06/2012   HCT 38.6 11/06/2012   MCV 86.5 11/06/2012   PLT 299 11/06/2012   Lab Results  Component Value Date   CREATININE 0.54 11/06/2012   BUN 8 11/06/2012   NA 138 11/06/2012   K 4.1 11/06/2012   CL 104 11/06/2012   CO2 27 11/06/2012    No results found for this basename: HGBA1C   Lipid Panel     Component Value Date/Time   CHOL 184 11/06/2012 1105   TRIG 144 11/06/2012 1105   HDL 43 11/06/2012 1105   CHOLHDL 4.3 11/06/2012 1105   VLDL 29 11/06/2012 1105   LDLCALC 112* 11/06/2012 1105       Assessment and plan:   Ira was seen today for follow-up.  Diagnoses and associated orders for this visit:  Anxiety and depression Suggest counseling for patient to help with feeling of being overwhelmed. Urged patient to seek counseling, patient refused due to scheduling concerns with her children.  Gave patient idea on ways to take time for herself. Suggested patient seek other parents with children with similar special needs and have social time. Patient denied psychiatry referral. Time spent with patient 25 minutes counseling.   Pain, dental - Ambulatory referral to Dentistry  Return in about 6 weeks (around 09/08/2013) for with Jegede for depression.   Ambrose FinlandValerie A Keck, NP-C [facility] [phone] 07/29/2013, 2:13 PM

## 2013-07-28 NOTE — Progress Notes (Signed)
Pt is here following up on her depression and anxiety. Pt has an interpreter today.

## 2013-07-28 NOTE — Patient Instructions (Signed)
El estrs (Stress) Los problemas mdicos relacionados con el estrs son cada vez ms frecuentes. El organismo tiene una respuesta fsica intrnseca para las situaciones estresantes. Cuando nos enfrentamos con situaciones complicadas, de presin o de peligro, tenemos que reaccionar rpidamente. Para ayudarnos a lograrlo, el organismo libera hormonas, como el cortisol y la [place]. Estas hormonas son parte de la respuesta "de lucha o escape" y afectan al metabolismo, la frecuencia cardaca y la presin arterial, y como resultado aumenta el estado de tensin que prepara al organismo para un desempeo ptimo al lidiar con una situacin estresante. Es probable que, para el hombre primitivo, estos mecanismos fueran una necesidad para mantenerse con vida. Pero generalmente, el estrs que [employer] en la actualidad no se origina por esta necesidad de subsistencia, y la liberacin de esas mismas hormonas puede daar la salud y Systems developer la capacidad de enfrentamiento. CAUSAS  Presin para lograr un buen desempeo laboral, escolar o deportivo.  Amenazas de violencia fsica.  Problemas de dinero.  Discusiones.  Conflictos familiares.  Divorcio o separacin de Civil engineer, contracting.  Duelo.  Empleo nuevo o desempleo.  Mudanzas.  Consumo excesivo de alcohol o drogas. A VECES, NO HAY UN MOTIVO ESPECFICO PARA DESARROLLAR ESTRS. Casi todas las personas corren riesgo de Restaurant manager, fast food en algn momento de sus vidas. Es importante saber que hay estrs temporario y estrs a Barrister's clerk.  El estrs temporario desaparecer cuando la situacin que lo genera se resuelva. La [employer] de las personas pueden sobrellevar los perodos cortos de Psychologist, forensic, que generalmente pueden [place] con actividades de Systems developer, caminatas, charlas [employer] problemas con amigos o simplemente con dormir bien por las noches.  Es mucho ms difcil Engineer, maintenance (IT) crnico (a largo plazo, continuo) que puede ser perjudicial desde el  punto de vista psicolgico y Architectural technologist, y daino tanto para la persona como para los amigos y Financial risk analyst. [place] personas reaccionan de maneras diferentes al estrs. Hay algunos efectos frecuentes que nos ayudan a Tour manager. En momentos de estrs extremo, las personas:  Pueden temblar de [place] incontrolable.  Pueden respirar con mayor rapidez y profundidad que lo normal (hiperventilar).  Pueden vomitar.  En el caso de los asmticos, el estrs puede desencadenar una crisis de asma.  En [employer], el estrs puede desencadenar cefaleas migraosas, lceras y dolor de cuerpo. LOS EFECTOS FSICOS DEL ESTRS PUEDEN INCLUIR LO SIGUIENTE:  Prdida de energa.  Problemas en la piel.  Dolores debido a la tensin muscular, entre ellos, dolor de cuello y de [place], y Tax adviser.  Intensificacin del dolor debido a artritis y Media planner.  Latidos cardacos irregulares (palpitaciones).  Perodos de irritabilidad o enojo.  Apata o depresin.  Ansiedad (sentir nerviosismo o preocupacin).  Conducta fuera de lo comn.  Prdida del apetito.  Ingesta de comidas para calmar la angustia.  Falta de concentracin.  Prdida o disminucin del deseo sexual.  Aumento del consumo de tabaco, alcohol o drogas.  En el caso de las mujeres, falta del perodo menstrual.  lceras, dolor en las articulaciones y Marketing executive. El estrs postraumtico es aquel causado por cualquier accidente grave, dao emocional grave o experiencia extremadamente difcil o violenta, como una violacin o Fabienne Bruns. Las vctimas de estrs postraumtico pueden tener emociones mezcladas, como temor, vergenza, depresin, culpa o enojo. Tambin pueden sentirse atormentados por recuerdos o imgenes inquietantes. Estas emociones pueden durar semanas, meses o incluso aos despus del evento traumtico que las desencaden. Hay tratamientos especializados disponibles, posiblemente con  medicamentos y  terapias psicolgicas. Si el Brewing technologist sntomas fsicos, distrs intenso o le dificulta el desempeo normal, vale la pena que consulte a su mdico. Es importante recordar que, aunque el estrs es [place] parte normal de la vida, el estrs extremo o prolongado puede causar otras enfermedades que requerirn [place]. Es mejor visitar a un mdico cuanto antes. Se ha asociado al estrs con el desarrollo de hipertensin arterial y enfermedades cardacas, as como insomnio y depresin. No hay una prueba diagnstica para el estrs, ya que cada persona reacciona de manera diferente. Sin embargo, un mdico podr [employer] sntomas fsicos, por ejemplo:  Dolores de [place].  [place].  lceras. El distrs emocional, como la preocupacin profunda, el desnimo o la irritabilidad deben detectarse cuando el mdico hace las preguntas pertinentes para identificar los problemas subyacentes que podran ser la causa. En caso de que existan motivos fsicos para los sntomas, es posible que el mdico tambin quiera hacer algunos estudios para Control and instrumentation engineer. Si cree que sufre de estrs, intente identificar los aspectos de su vida que lo estn causando. Algunas veces no puede cambiarlos ni evitarlos, pero incluso una modificacin pequea puede tener un efecto domin positivo. Un cambio sencillo en el estilo de vida puede marcar la diferencia. ESTRATEGIAS QUE PUEDEN AYUDAR A MANEJAR EL ESTRS:  Delegar o compartir las responsabilidades.  [place] confrontaciones.  Aprender a ser ms asertivo.  Hacer actividad fsica regularmente.  Evitar el consumo de alcohol o de drogas ilegales para sobrellevar la situacin.  Consumir una dieta sana y equilibrada, con alto contenido de frutas, verduras y protenas.  Encontrar el humor o el absurdo en las situaciones estresantes.  Nunca asumir ms responsabilidades que las que sabe que puede manejar con comodidad.  Organizar mejor el  tiempo para hacer lo ms posible.  Hablar con los amigos o la familia y Publishing rights manager los pensamientos y los temores.  Escuchar msica o cintas de audio de relajacin.  Tensionar y [employer], desde los dedos de los pies Librarian, academic a la cabeza y el cuello. Si cree que recibir ayuda sera til, ya sea para identificar los factores que lo estresan o para aprender las tcnicas que lo ayuden a [place], visite a un mdico capaz de ayudarlo. En lugar de depender de los medicamentos, siempre es mejor tratar de identificar los aspectos de su vida que le provocan estrs y aprender a Licensed conveyancer. Hay muchas tcnicas para controlar el estrs, entre ellas, el asesoramiento, la psicoterapia, la [place], el yoga y Adult nurse. Un mdico puede ayudarlo a Teacher, adult education qu es lo mejor para usted. Document Released: 12/04/2012 [facility] Patient Information 2014 [place], [place]. Depresin en el adulto  (Depression, Adult) La depresin es un sentimiento de tristeza, decaimiento, sufrimiento espiritual o vaco. Hay dos tipos de depresin:  1. La depresin que todos experimentamos de tanto en tanto debido a experiencias de la vida inquietantes, como la prdida del [place] o el final de una relacin (tristeza normal o duelo normal). Este tipo de depresin se considera normal, es de corta duracin y se Advertising copywriter unos pocos das y 2 semanas. (La depresin que se experimenta tras la prdida de un ser querido se llama duelo. El duelo en general dura ms de 2 semanas, pero normalmente mejora con el tiempo). 2. La depresin clnica, es la que dura ms que la tristeza o duelo normal o la que interfiere con su capacidad de Pension scheme manager, en el trabajo y en la escuela. Tambin interfiere en  las [employer]. Afecta casi todos los aspectos de la vida. La depresin clnica es una enfermedad. Los sntomas de depresin pueden tener su origen en otras afecciones que no sean la tristeza y el  duelo o la depresin clnica. Ejemplos de estas afecciones son:   Virgina Evener fsicas: Algunas enfermedades fsicas, incluyendo poca actividad de la glndula tiroides (hipotiroidismo), anemia grave, ciertos tipos de cncer, diabetes, convulsiones incontrolables, problemas cardacos y pulmonares, ictus y Conservation officer, historic buildings crnico se asocian con sntomas de depresin.  Efectos secundarios de algn medicamento recetado: En Psychologist, clinical, ciertos tipos de medicamentos recetados pueden causar sntomas de depresin.  Abuso de sustancias: El abuso de alcohol y drogas puede causar sntomas de depresin. [place] y duelo normal son:   Jodelle Gross o llorar durante perodos cortos de [place].  Falta de preocupacin por todo (apata).  Dificultad para dormir o dormir demasiado.  No poder disfrutar de las cosas que antes disfrutaba.  El deseo de estar solo todo el tiempo (aislamiento social).  Falta de energa o motivacin.  Dificultad para concentrarse o recordar.  Cambios en el apetito o en el peso.  Inquietud o agitacin. Los sntomas de la depresin [place] son los mismos de la tristeza o duelo normal y tambin [employer] siguientes sntomas:   Sentirse triste o llorar todo el [place].  Sentimientos de culpa o inutilidad.  Sentimientos de desesperanza o desamparo.  Pensamientos de suicidio o el deseo de daarse a s mismo (ideas suicidas).  Prdida de contacto con la realidad (sntomas psicticos). Ver o escuchar cosas que no son reales (alucinaciones)o tener creencias falsas acerca de su vida o de las personas que lo rodean (delirios y paranoia). DIAGNSTICO  El diagnstico de la depresin clnica se basa en la gravedad y duracin de los sntomas. El V[employer]om har preguntas sobre su historia clnica y [place] de sustancias para determinar si una enfermedad fsica, el uso de medicamentos recetados, o el abuso de sustancias es la causa de su depresin. Su mdico  tambin puede indicar anlisis de [place].  TRATAMIENTO  Por lo general, la tristeza y el duelo normal no requieren tratamiento. Pero a veces se recetan antidepresivos durante el duelo para [employer] sntomas de depresin hasta que se resuelven.  El tratamiento para la depresin clnica depende de la gravedad de los sntomas, pero suele incluir antidepresivos, psicoterapia con un profesional de la salud mental o una combinacin de [place]. El V[employer]om ayudar a Teacher, adult education qu tratamiento es el mejor para usted.  La depresin causada por una enfermedad fsica generalmente desaparece con tratamiento mdico adecuado de la enfermedad. Si un medicamento recetado le causa depresin, hable con su mdico acerca de suspenderlo, disminuir la dosis o sustituirlo por otro medicamento.  La depresin causada por el abuso de alcohol o abuso de drogas ilcitas se va con la abstinencia de estas sustancias. Algunos adultos necesitan ayuda profesional con el fin de dejar de beber o usar drogas.  SOLICITE ATENCIN MDICA DE INMEDIATO SI:   Tiene pensamientos acerca de lastimarse o daar a Producer, television/film/video.  Pierde el contacto con la realidad (tiene sntomas psicticos).  Est tomando medicamentos para la depresin y tiene Financial trader graves. [place] on Mental Illness: www.nami.[employer] of Mental Health: https://carter.com/  Document Released: 02/13/2005 Document Revised: 08/15/2011 [facility] Patient Information 2014 [place].

## 2013-08-08 ENCOUNTER — Encounter: Payer: Self-pay | Admitting: Emergency Medicine

## 2013-08-08 ENCOUNTER — Other Ambulatory Visit: Payer: Self-pay

## 2013-08-08 MED ORDER — DIPHENHYDRAMINE HCL 25 MG PO TABS: 25.0000 mg | ORAL_TABLET | Freq: Four times a day (QID) | ORAL | Status: DC | PRN

## 2013-08-08 MED ORDER — HYDROCORTISONE 2.5 % EX CREA: TOPICAL_CREAM | Freq: Two times a day (BID) | CUTANEOUS | Status: DC

## 2013-08-08 MED ORDER — PREDNISONE 20 MG PO TABS: 20.0000 mg | ORAL_TABLET | Freq: Every day | ORAL | Status: DC

## 2013-08-08 NOTE — Patient Instructions (Signed)
Pt instructed to take Prednisone 20 mg x 5 dys with benadryl and return to clinic 08/12/2013

## 2013-08-08 NOTE — Progress Notes (Unsigned)
Patient ID: [name], female   DOB: [birth year], 33 y.o.   MRN: [id number] Pt comes in with c/o blistered rash that started on bottom feet with spreading to arms,hands and chest x 3 dys. Pt denies bite,new detergents or food allergy. No otc meds tried. States when itching rash spread to new areas. Afebrile.denies cold sx's.

## 2013-08-12 ENCOUNTER — Ambulatory Visit: Payer: No Typology Code available for payment source | Attending: Internal Medicine | Admitting: *Deleted

## 2013-08-12 DIAGNOSIS — R21 Rash and other nonspecific skin eruption: Secondary | ICD-10-CM

## 2013-08-12 NOTE — Progress Notes (Signed)
Patient ID: [name], female   DOB: 1980/03/21, 33 y.o.   MRN: [id number] Patient presents for follow up form visit on August 08, 2013.  Patient was given hydrocortisone cream, benadryl, and prednisone for a blistered rash on the feet, arms, chest, and hands. Today the patient has no complaints. The blisters have dried and healed. Patient states she has no itching. Informed patient to finish Prednisone as prescribed. Patient verbalized understanding. Interpreter was present with client during visit. Annamaria Hellingose,Jamie Renee, RN

## 2013-09-09 ENCOUNTER — Ambulatory Visit: Payer: Self-pay | Attending: Internal Medicine | Admitting: Internal Medicine

## 2013-09-09 ENCOUNTER — Encounter: Payer: Self-pay | Admitting: Internal Medicine

## 2013-09-09 VITALS — BP 106/68 | HR 70 | Temp 97.9°F | Resp 16 | Ht 59.0 in | Wt 127.0 lb

## 2013-09-09 DIAGNOSIS — F411 Generalized anxiety disorder: Secondary | ICD-10-CM | POA: Insufficient documentation

## 2013-09-09 DIAGNOSIS — F32A Depression, unspecified: Secondary | ICD-10-CM

## 2013-09-09 DIAGNOSIS — F329 Major depressive disorder, single episode, unspecified: Secondary | ICD-10-CM | POA: Insufficient documentation

## 2013-09-09 DIAGNOSIS — F341 Dysthymic disorder: Secondary | ICD-10-CM

## 2013-09-09 DIAGNOSIS — F419 Anxiety disorder, unspecified: Secondary | ICD-10-CM

## 2013-09-09 DIAGNOSIS — F3289 Other specified depressive episodes: Secondary | ICD-10-CM | POA: Insufficient documentation

## 2013-09-09 MED ORDER — SERTRALINE HCL 50 MG PO TABS: 50.0000 mg | ORAL_TABLET | Freq: Every day | ORAL | Status: DC

## 2013-09-09 NOTE — Progress Notes (Signed)
Patient ID: Amy Tyler, female   DOB: [birth year], 33 y.o.   MRN: [id number]   Amy Tyler, is a 33 y.o. female  [id number]  [id number]  DOB - 11-13-1980  Chief Complaint  Patient presents with  . Follow-up        Subjective:   Amy Tyler is a 33 y.o. female here today for a follow up visit. Patient has history of anxiety and depression here today following up on her depression. She states that she is feeling much better, she has no suicidal ideation or thoughts. She needs refill of her medications. Patient has No headache, No chest pain, No abdominal pain - No Nausea, No new weakness tingling or numbness, No Cough - SOB.  No problems updated.  ALLERGIES: No Known Allergies  PAST MEDICAL HISTORY: Past Medical History  Diagnosis Date  . Anxiety   . Depression     MEDICATIONS AT HOME: Prior to Admission medications   Medication Sig Start Date End Date Taking? Authorizing Provider  sertraline (ZOLOFT) 50 MG tablet Take 1 tablet (50 mg total) by mouth daily. 09/09/13  Yes Jeanann Lewandowsky, MD  diphenhydrAMINE (BENADRYL) 25 MG tablet Take 1 tablet (25 mg total) by mouth every 6 (six) hours as needed. 08/08/13   Ambrose Finland, NP  esomeprazole (NEXIUM) 40 MG capsule Take 1 capsule (40 mg total) by mouth daily. 11/06/12   Jeanann Lewandowsky, MD  hydrocortisone 2.5 % cream Apply topically 2 (two) times daily. 08/08/13   Ambrose Finland, NP  LORazepam (ATIVAN) 0.5 MG tablet Take 0.5 mg by mouth every 8 (eight) hours.    Historical Provider, MD  predniSONE (DELTASONE) 20 MG tablet Take 1 tablet (20 mg total) by mouth daily with breakfast. 08/08/13   Ambrose Finland, NP     Objective:   Filed Vitals:   09/09/13 0942  BP: 106/68  Pulse: 70  Temp: 97.9 F (36.6 C)  TempSrc: Oral  Resp: 16  Height: 4\' 11"  (1.499 m)  Weight: 127 lb (57.607 kg)  SpO2: 97%    Exam General appearance : Awake, alert, not in any distress. Speech Clear. Not toxic  looking HEENT: Atraumatic and Normocephalic, pupils equally reactive to light and accomodation Neck: supple, no JVD. No cervical lymphadenopathy.  Chest:Good air entry bilaterally, no added sounds  CVS: S1 S2 regular, no murmurs.  Abdomen: Bowel sounds present, Non tender and not distended with no gaurding, rigidity or rebound. Extremities: B/L Lower Ext shows no edema, both legs are warm to touch Neurology: Awake alert, and oriented X 3, CN II-XII intact, Non focal Skin:No Rash Wounds:N/A  Data Review No results found for this basename: HGBA1C     Assessment & Plan   1. Anxiety and depression Increase Zoloft to 50 mg tablet by mouth daily - sertraline (ZOLOFT) 50 MG tablet; Take 1 tablet (50 mg total) by mouth daily.  Dispense: 30 tablet; Refill: 3  Patient was counseled extensively about nutrition and exercise He has been referred to our social worker here today for counseling  Return in about 3 months (around 12/10/2013), or if symptoms worsen or fail to improve, for Generalized Anxiety Disorder.  The patient was given clear instructions to go to ER or return to medical center if symptoms don't improve, worsen or new problems develop. The patient verbalized understanding. The patient was told to call to get lab results if they haven't heard anything in the next week.   This note has been created with Teaching laboratory technician  and smart Lobbyistphrase technology. Any transcriptional errors are unintentional.    Jeanann LewandowskyJEGEDE, Holli Rengel, MD, MHA, FACP, FAAP [facility], [place] [phone]   09/09/2013, 10:20 AM

## 2013-09-09 NOTE — Progress Notes (Signed)
Pt is here following up on her depression. Pt states that she is feeling much better but still having some days w/ depression.

## 2013-09-09 NOTE — Patient Instructions (Signed)
Depresin en el adulto  (Depression, Adult) La depresin es un sentimiento de tristeza, decaimiento, sufrimiento espiritual o vaco. Hay dos tipos de depresin:  1. La depresin que todos experimentamos de tanto en tanto debido a experiencias de la vida inquietantes, como la prdida del [place] o el final de una relacin (tristeza normal o duelo normal). Este tipo de depresin se considera normal, es de corta duracin y se Furniture conservator/restorer unos [address] y 2 semanas. (La depresin que se experimenta tras la prdida de un ser querido se llama duelo. El duelo en general dura ms de 2 semanas, pero normalmente mejora con el tiempo). 2. La depresin clnica, es la que dura ms que la tristeza o duelo normal o la que interfiere con su capacidad de Counselling psychologist, en el trabajo y en la escuela. Tambin interfiere en las relaciones personales. Afecta casi todos los aspectos de la vida. La depresin clnica es una enfermedad. Los sntomas de depresin pueden tener su origen en otras afecciones que no sean la tristeza y el duelo o la depresin clnica. Ejemplos de estas afecciones son:   Lilia Argue fsicas: Algunas enfermedades fsicas, incluyendo poca actividad de la glndula tiroides (hipotiroidismo), anemia grave, ciertos tipos de cncer, diabetes, convulsiones incontrolables, problemas cardacos y pulmonares, ictus y Chief Technology Officer crnico se asocian con sntomas de depresin.  Efectos secundarios de algn medicamento recetado: En International aid/development worker, ciertos tipos de medicamentos recetados pueden causar sntomas de depresin.  Abuso de sustancias: El abuso de alcohol y drogas puede causar sntomas de depresin. SNTOMAS Los sntomas de tristeza y duelo normal son:   Lolita Lenz o llorar durante perodos cortos de [place].  Falta de preocupacin por todo (apata).  Dificultad para dormir o dormir demasiado.  No poder disfrutar de las cosas que antes disfrutaba.  El deseo de estar solo todo el  tiempo (aislamiento social).  Falta de energa o motivacin.  Dificultad para concentrarse o recordar.  Cambios en el apetito o en el peso.  Inquietud o agitacin. Los sntomas de la depresin [place] son los mismos de la tristeza o duelo normal y tambin [employer] siguientes sntomas:   Sentirse triste o llorar todo el [place].  Sentimientos de culpa o inutilidad.  Sentimientos de desesperanza o desamparo.  Pensamientos de suicidio o el deseo de daarse a s mismo (ideas suicidas).  Prdida de contacto con la realidad (sntomas psicticos). Ver o escuchar cosas que no son reales (alucinaciones)o tener creencias falsas acerca de su vida o de las personas que lo rodean (delirios y paranoia). DIAGNSTICO  El diagnstico de la depresin clnica se basa en la gravedad y duracin de los sntomas. El [employer]ot har preguntas sobre su historia clnica y [place] de sustancias para determinar si una enfermedad fsica, el uso de medicamentos recetados, o el abuso de sustancias es la causa de su depresin. Su mdico tambin puede indicar anlisis de [place].  TRATAMIENTO  Por lo general, la tristeza y el duelo normal no requieren tratamiento. Pero a veces se recetan antidepresivos durante el duelo para [employer] sntomas de depresin hasta que se resuelven.  El tratamiento para la depresin clnica depende de la gravedad de los sntomas, pero suele incluir antidepresivos, psicoterapia con un profesional de la salud mental o una combinacin de [place]. El [employer]ot ayudar a Chief Strategy Officer qu tratamiento es el mejor para usted.  La depresin causada por una enfermedad fsica generalmente desaparece con tratamiento mdico adecuado de la enfermedad. Si un medicamento recetado [employer]  depresin, hable con su mdico acerca de suspenderlo, disminuir la dosis o sustituirlo por otro medicamento.  La depresin causada por el abuso de alcohol o abuso de drogas ilcitas se va con la abstinencia de estas  sustancias. Algunos adultos necesitan ayuda profesional con el fin de dejar de beber o usar drogas.  SOLICITE ATENCIN MDICA DE INMEDIATO SI:   Tiene pensamientos acerca de lastimarse o daar a Economist.  Pierde el contacto con la realidad (tiene sntomas psicticos).  Est tomando medicamentos para la depresin y tiene Hospital doctor graves. Irven Shelling MS INFORMACIN National Alliance on Mental Illness: www.nami.AK [employer] Holding Corporation of Mental Health: http://www.maynard.net/  Document Released: 02/13/2005 Document Revised: 08/15/2011 ExitCare Patient Information 2015 [place], [place]. This information is not intended to replace advice given to you by your health care provider. Make sure you discuss any questions you have with your health care provider. Trastorno de ansiedad generalizada  (Generalized Anxiety Disorder) El trastorno de ansiedad generalizada es un trastorno mental. Interfiere en las funciones vitales, incluyendo las [place], el trabajo y la escuela.  Es diferente de la ansiedad normal que todas las personas experimentan en algn momento de su vida en respuesta a sucesos y Chief Operating Officer. En verdad, la ansiedad normal nos ayuda a prepararnos y Human resources officer acontecimientos y actividades de la vida. La ansiedad normal desaparece despus de que el evento o la actividad ha finalizado.  El trastorno de ansiedad generalizada no est necesariamente relacionada con eventos o actividades especficas. Tambin causa un exceso de ansiedad en proporcin a sucesos o actividades especficas. En este trastorno la ansiedad es difcil de Chief Operating Officer. Los sntomas pueden variar de leves a muy graves. Las personas que sufren de trastorno de ansiedad generalizada pueden tener intensas olas de ansiedad con sntomas fsicos (ataques de pnico).  SNTOMAS  La ansiedad y la preocupacin asociada a este trastorno son difciles de Chief Operating Officer. Esta ansiedad y la preocupacin estn  relacionados con muchos eventos de la vida y sus actividades y tambin ocurre durante ms [employer] que no ocurre, durante 6 meses o ms. Las personas que la sufren pueden tener tres o ms de los siguientes sntomas (uno o ms en los nios):   Glass blower/designer.  Dificultades de concentracin.   Irritabilidad.  Tensin muscular  Dificultad para dormirse o sueo poco satisfactorio. DIAGNSTICO  Se diagnostica a travs de una evaluacin realizada por el mdico. El mdico le har preguntas acerca de su estado de nimo, sntomas fsicos y sucesos de [place] vida. Le har preguntas sobre su historia clnica, el consumo de alcohol o drogas, incluyendo los medicamentos recetados. N[employer] Corporation un examen fsico e indicar anlisis de [place]. Ciertas enfermedades y el uso de determinadas sustancias pueden causar sntomas similares a este trastorno. Su mdico lo puede derivar a Music therapist en salud mental para una evaluacin ms profunda.Gerlean Ren  Las terapias siguientes se utilizan en el tratamiento de este trastorno:   Medicamentos - Se recetan antidepresivos para el control diario a Air cabin crew. Pueden indicarse tambin medicamentos para combatir la [employer] graves, especialmente cuando ocurren ataques de pnico.   Terapia conversada (psicoterapia) Ciertos tipos de psicoterapia pueden ser tiles en el tratamiento del trastorno de ansiedad generalizada, proporcionando apoyo, educacin y Optometrist. Una forma de psicoterapia llamada terapia cognitivo-conductual puede ensearle formas saludables de pensar y Publishing rights manager a los eventos y actividades de la vida diaria.  Tcnicasde manejo del estrs- Estas tcnicas incluyen el yoga, la meditacin y [place]  ejercicio y pueden ser muy tiles cuando se practican con regularidad. Un especialista en salud mental puede ayudar a determinar qu tratamiento es mejor para usted. Algunas personas obtienen mejora con una terapia. Sin embargo,  Economistotras personas requieren una combinacin de terapias.  Document Released: 06/10/2012 [facility] Patient Information 2015 [place], [place]. This information is not intended to replace advice given to you by your health care provider. Make sure you discuss any questions you have with your health care provider.

## 2013-09-09 NOTE — Progress Notes (Signed)
LCSW met with patient in order to assess current mental health needs. Patient identified mild depressive symptoms, but identified that at this time they are manageable but she does not have support for her children during the summer.  LCSW encouraged the patient to continue with current processes and return for further mental health treatment once the children return to school or if her symptoms worsen.  Christene Lye MSW, Luciana.Gehrig ]

## 2013-12-02 ENCOUNTER — Ambulatory Visit: Payer: Self-pay | Attending: Internal Medicine

## 2014-07-31 ENCOUNTER — Ambulatory Visit: Payer: Self-pay | Attending: Internal Medicine | Admitting: Clinical

## 2014-07-31 DIAGNOSIS — F32A Depression, unspecified: Secondary | ICD-10-CM

## 2014-07-31 DIAGNOSIS — F419 Anxiety disorder, unspecified: Principal | ICD-10-CM

## 2014-07-31 DIAGNOSIS — F329 Major depressive disorder, single episode, unspecified: Secondary | ICD-10-CM

## 2014-07-31 NOTE — Progress Notes (Signed)
ASSESSMENT: Pt currently experiencing symptoms of anxiety and depression. Pt needs to make an appointment w PCP and [facility] Pt would benefit from psychoeducation and supportive counseling to cope with symptoms of anxiety and depression.  Stage of Change: cognitive  PLAN: 1. F/U with behavioral health consultant in one week 2. Psychiatric Medications: none. 3. Behavioral recommendation(s):   -Consider daily breathing exercises -Consider calling [facility] provided [facility] -[facility] if [facility] -Consider reading over depression goal worksheet provided. -Make appointment to see PCP, and discuss BH medications  SUBJECTIVE: Pt. referred by self for anxiety and depression:  Pt. here for initial consultation regarding coping with symptoms of anxiety and depression.  Pt. reports the following symptoms/concerns: Pt states that she received treatment for symptoms of anxiety and depression about a year ago at [facility]. Her symptoms improved, and then she discontinued taking the medication. Pt states that she has taken herbal treatments (possibly Valerian Root) for anxiety; she used to exercise, but her schedule no longer allows that. Currently, she is having the biggest issue with not being able to stop the incessant worry that runs through her mind. She watches videos that help with her symptoms of anxiety, but she feels it is increasing. Duration of problem: increasing in severity is >1 year Severity: moderate  OBJECTIVE: Orientation & Cognition: Oriented x3. Thought processes normal and appropriate to situation. Mood: teary. Affect: appropriate Appearance: appropriate Risk of harm to self or others: no risk of harm to self or others Substance use: none Psychiatric medication use: Unchanged from prior contact. Assessments administered: PSQ9-9/ GAD7-9  Diagnosis: Anxiety and depression CPT Code:  F41.8 -------------------------------------------- Other(s) present in the room: Interpreter  Time spent with patient in exam room: 60 minutes

## 2015-04-12 ENCOUNTER — Ambulatory Visit (INDEPENDENT_AMBULATORY_CARE_PROVIDER_SITE_OTHER): Payer: Self-pay | Admitting: Family Medicine

## 2015-04-12 VITALS — BP 108/69 | HR 72 | Temp 98.4°F | Resp 20 | Ht 59.0 in | Wt 131.4 lb

## 2015-04-12 DIAGNOSIS — Z789 Other specified health status: Secondary | ICD-10-CM

## 2015-04-12 DIAGNOSIS — G4700 Insomnia, unspecified: Secondary | ICD-10-CM

## 2015-04-12 DIAGNOSIS — Z658 Other specified problems related to psychosocial circumstances: Secondary | ICD-10-CM

## 2015-04-12 DIAGNOSIS — Z8659 Personal history of other mental and behavioral disorders: Secondary | ICD-10-CM

## 2015-04-12 MED ORDER — CLONAZEPAM 0.5 MG PO TABS: 0.5000 mg | ORAL_TABLET | Freq: Every evening | ORAL | Status: DC | PRN

## 2015-04-12 NOTE — Patient Instructions (Addendum)
Klonopin un pastilla cada noche si necesario. otra informacion abajo y numeros si necesario  regrese en 2-3 semanas hablar a Jaynee Eagles, [place]. Mas temprano si empeorse.   Pryor Montes: [phone] Royetta Crochet: [phone]  Levester Fresh y control del estrs (Stress and Stress Management) El estrs es una reaccin normal a los sucesos de la vida. Es lo que se siente cuando la vida le exige ms de lo que est acostumbrado o ms de lo que puede [place]. Un poco de Energy East Corporation ser til. Por ejemplo, la reaccin al estrs puede ayudarlo a tomar el ltimo autobs del da, a estudiar para un examen o a cumplir con un plazo lmite en el trabajo. Sin embargo, el Retail buyer frecuente o que dura mucho tiempo puede causar problemas. Puede afectar la salud emocional y [place] y las actividades cotidianas normales. El exceso de estrs puede Academic librarian sistema inmunitario y aumentar el riesgo de que tenga enfermedades fsicas. Si ya tiene un problema mdico, el estrs puede empeorarlo. CAUSAS  The Mutual [employer] Omaha sucesos de la vida pueden causar estrs. Un suceso que le causa estrs a una persona puede no ser estresante para Theatre manager. Generalmente, los sucesos importantes de la vida causan estrs. Estos pueden ser positivos o negativos, por ejemplo, quedarse sin empleo, mudarse a una casa nueva, casarse, tener un beb o perder a un ser querido. Los sucesos de la vida menos evidentes tambin pueden causar estrs, especialmente si ocurren da tras da o en combinacin. Por ejemplo, trabajar muchas horas, conducir cuando hay mucho trnsito, cuidar a los nios, tener deudas o tener una relacin difcil. SIGNOS Y SNTOMAS El estrs puede causar sntomas emocionales que incluyen lo siguiente:  Ansiedad. Sentirse preocupado, temeroso, nervioso, abrumado o fuera de control.  Enojo. Sentirse irritado o impaciente.  Depresin. Sentirse triste, decado, desesperanzado o culpable.  Dificultad para concentrarse, recordar  o tomar decisiones. El estrs puede causar sntomas fsicos que incluyen lo siguiente:   Dolores y [place]. Estos pueden afectar la cabeza, el cuello, la espalda, el estmago u otras zonas del cuerpo.  Rigidez muscular o tensin mandibular.  Poca energa o dificultad para dormir. El estrs puede provocar conductas poco saludables, que incluyen lo siguiente:   Comer para sentirse mejor (comer en exceso) o IT consultant comidas.  Dormir Dow Chemical, mucho o ambas cosas.  Trabajar mucho o postergar tareas (posponer).  Fumar, beber alcohol o consumir drogas para sentirse mejor. DIAGNSTICO  El estrs se diagnostica a travs de una evaluacin que realiza el mdico. El mdico le har preguntas sobre los sntomas o cualquier suceso estresante de la vida. Adems, el mdico le har preguntas sobre sus antecedentes mdicos y tal vez indique anlisis de [place] u otros estudios. Ciertas afecciones y algunos medicamentos pueden provocar sntomas fsicos similares a los del estrs. Las enfermedades mentales pueden causar sntomas emocionales y Actor conductas poco sanas similares a los del estrs. El mdico podr derivarlo a un profesional de la salud mental para que le realice una evaluacin ms profunda.  TRATAMIENTO  El tratamiento recomendado para el estrs es el control del estrs. Los Berkshire Hathaway del control del estrs son reducir los eventos estresantes de la vida y enfrentar el estrs de maneras saludables.  Las tcnicas para reducir los eventos estresantes de la vida incluyen lo siguiente:  Identificacin del estrs. Autocontrolar el estrs e identificar qu lo causa. Estas habilidades pueden ayudarlo a evitar algunos sucesos estresantes.  Control del tiempo. Establecer las prioridades, llevar un calendario de los sucesos  y aprender a decir "no". Estas herramientas pueden ayudarlo a evitar que asuma muchos compromisos. Las tcnicas para lidiar con el estrs incluyen lo siguiente:  Replantearse el  problema. Tratar de pensar de un modo realista los eventos estresantes en lugar de ignorarlos o Horticulturist, commercial. Intente encontrar los aspectos positivos en una situacin estresante, en lugar de enfocarse en los negativos.  La actividad fsica. El ejercicio fsico puede liberar la tensin fsica y Architectural technologist. La clave es encontrar un tipo de ejercicio fsico que disfrute y practique con regularidad.  Tcnicas de relajacin. Estas relajan el cuerpo y la [place]. Los ejemplos son el yoga, la meditacin, el tai chi, la biorregulacin, la respiracin profunda, la relajacin muscular progresiva, Conservation officer, nature, estar al Auto-Owners Insurance en contacto con la naturaleza, llevar un diario y otros pasatiempos. Nuevamente, la clave es encontrar una o ms opciones que disfrute y Hydrographic surveyor con regularidad.  Estilo de vida saludable. Coma una dieta equilibrada, duerma mucho y no fume. Evite el consumo de alcohol o de drogas para relajarse.  Red de apoyo slida. Pase tiempo con la familia, los amigos y otras personas cuya compaa disfrute. Exprese sus sentimientos y converse acerca de las cosas que le preocupan con alguien en quien confe. La orientacin o la psicoterapiacon un profesional de la salud mental pueden ser de ayuda si tiene dificultades para controlar el estrs por s solo. Generalmente, no se recomiendan medicamentos para el tratamiento del estrs. Hable con el mdico si considera que necesita medicamentos para los sntomas de estrs. INSTRUCCIONES PARA EL CUIDADO EN EL HOGAR  Concurra a todas las visitas de control como se lo haya indicado el mdico.  Tome todos los medicamentos como se lo haya indicado el mdico. SOLICITE ATENCIN MDICA SI:  Los sntomas empeoran o empieza a tener sntomas nuevos.  Se siente abrumado por los problemas y ya no puede manejarlos solo. SOLICITE ATENCIN MDICA DE INMEDIATO SI:  Siente deseos de lastimarse o lastimar a Nurse, children's.   Esta informacin  no tiene Marine scientist el consejo del mdico. Asegrese de hacerle al mdico cualquier pregunta que tenga.   Document Released: 11/23/2004 Document Revised: 03/06/2014 Elsevier Interactive Patient Education 2016 [place] (Insomnia) El insomnio es un trastorno del sueo que causa dificultades para conciliar el sueo o para [place]. Puede producir cansancio (fatiga), falta de energa, dificultad para concentrarse, cambios en el estado de nimo y mal rendimiento escolar o laboral.  Hay tres formas diferentes de clasificar el insomnio:   Dificultad para conciliar el sueo.  Dificultad para mantener el sueo.  Despertar muy precoz por la maana. Cualquier tipo de insomnio puede ser a Barrister's clerk (crnico) o a Control and instrumentation engineer (agudo). Ambos son frecuentes. Generalmente, el insomnio a corto plazo dura tres meses o menos tiempo. El crnico ocurre al menos tres veces por semana durante ms de tres meses. CAUSAS  El insomnio puede deberse a otra afeccin, situacin o sustancia, por ejemplo:  Ansiedad.  Algunos medicamentos.  Enfermedad por reflujo gastroesofgico (ERGE) u otras enfermedades gastrointestinales.  Asma y otras enfermedades respiratorias.  Sndrome de las piernas inquietas, apnea del sueo u otros trastornos del sueo.  Dolor crnico.  Menopausia, que puede incluir calores repentinos.  Ictus.  Consumo excesivo de alcohol, tabaco u drogas ilegales.  Depresin.  Cafena.  Trastornos neurolgicos, como enfermedad de Alzheimer.  Hiperactividad tiroidea (hipertiroidismo). Es posible que la causa del insomnio no se conozca. [place]  insomnio incluyen lo siguiente:  El sexo. La mujeres se ven ms afectadas que los hombres.  La edad. El insomnio es ms frecuente a medida que una persona envejece.  El estrs. Esto puede incluir su vida profesional o personal.  Los ingresos. El insomnio es ms frecuente  en las personas cuyos ingresos son ms bajos.  La falta de actividad fsica.  Los horarios de trabajo irregulares o los turnos nocturnos.  Los viajes a lugares de diferentes zonas horarias. SIGNOS Y SNTOMAS Si tiene insomnio, el sntoma principal es la dificultad para conciliar el sueo o mantenerlo. Esto puede derivar en otros sntomas, por ejemplo:  Sentirse fatigado.  Ponerse nervioso por [employer] Stores irse a dormir.  No sentirse descansado por la maana.  Tener dificultad para concentrarse.  Sentirse irritable, ansioso o deprimido. TRATAMIENTO  El tratamiento para el insomnio depende de la causa. Si se debe a una enfermedad preexistente, el tratamiento se centrar en el abordaje de la enfermedad. El tratamiento tambin puede incluir lo siguiente:   Medicamentos que lo ayuden a dormir.  Asesoramiento psicolgico o terapia.  Cambios en el estilo de vida. [place] los medicamentos solamente como se lo haya indicado el mdico.  Establezca horarios habituales para dormir y Clinical cytogeneticist. No tome siestas.  Lleve un registro del sueo ya que podra ser de utilidad para que usted y a su mdico puedan determinar qu podra estar causndole insomnio. Incluya lo siguiente:  Cundo duerme.  Cundo se despierta durante la noche.  Qu tan bien duerme.  Qu tan relajado se siente al da siguiente.  Cualquier efecto secundario de los medicamentos que toma.  Lo que usted come y bebe.  Convierta a su habitacin en un lugar cmodo donde sea fcil conciliar el sueo:  Coloque persianas o cortinas especiales oscuras que impidan la entrada de la luz del exterior.  Para bloquear los ruidos, use un aparato que reproduce sonidos ambientales o relajantes de fondo.  Mantenga baja la temperatura.  Haga ejercicio regularmente como se lo haya indicado el mdico. No haga ejercicio justo antes de la hora de [place].  Utilice tcnicas de relajacin  para controlar el estrs. [place] al mdico que le sugiera algunas tcnicas que sean adecuadas para usted. Estos pueden incluir lo siguiente:  Ejercicios de respiracin.  Rutinas para aliviar la tensin muscular.  Visualizacin de escenas apacibles.  Disminucin del consumo de alcohol, bebidas con cafena y cigarrillos, especialmente cerca de la hora de [place], ya que pueden perturbarle el sueo.  No coma en exceso ni consuma comidas picantes justo antes de la hora de [place]. Esto puede causarle molestias digestivas y dificultades para dormir.  Limite el uso de pantallas antes de la hora de [place]. Esto incluye lo siguiente:  Mirar televisin.  Usar el telfono inteligente, la tableta y la computadora.  Siga una rutina. Esto puede ayudarlo a conciliar el sueo ms rpidamente. Intente hacer una actividad tranquila, cepillarse los dientes e irse a la cama a la misma hora todas las noches.  Levntese de la cama si sigue despierto despus de 65mnutos de haber intentado dormirse. MDeshlerluces, pero intente leer o hacer una actividad tranquila. Cuando tenga sueo, regrese a lFutures trader  Conduzca con cuidado. No conduzca si est muy somnoliento.  Concurra a todas las visitas de control, segn le indique su mdico. Esto es importante. SOLICITE ATENCIN MDICA SI:   Est cansado durante el da o tiene dificultades en su rutina diaria  debido a la somnolencia.  Sigue teniendo problemas para dormir o Press photographer. SOLICITE ATENCIN MDICA DE INMEDIATO SI:   Tiene pensamientos serios acerca de lastimarse a usted mismo o daar a Nurse, children's.   Esta informacin no tiene Marine scientist el consejo del mdico. Asegrese de hacerle al mdico cualquier pregunta que tenga.   Document Released: 02/13/2005 Document Revised: 03/06/2014 Elsevier Interactive Patient Education [employer].

## 2015-04-12 NOTE — Progress Notes (Signed)
Subjective:  By signing my name below, I, Moises Blood, attest that this documentation has been prepared under the direction and in the presence of Merri Ray, MD. Electronically Signed: Moises Blood, [place]. 04/12/2015 , 6:53 PM .  Patient was seen in Room 8 .   Patient ID: Amy Tyler, female    DOB: May 12, 1980, 35 y.o.   MRN: [id number] Chief Complaint  Patient presents with  . Insomnia    x 1 month    HPI Amy Tyler is a 35 y.o. female H/o anxiety and depression based on her problems list. Seen by [facility], June 2016 was the last visit. At that time, she was on zoloft 50 mg qd and ativan as needed. She was referred to social work for counseling who she saw for 3 months. Based on notes, she did not have support for her children during the summer so she was unable to return for counseling. She wasn't able to see counselor until children were back in school. She denies smoking, or alcohol use.   Insomnia Pt states that she's had intermittent problems sleeping for the past 2 years, occurring more frequent now. She's not sleeping well and wakes up at 3:00AM most nights. She wouldn't feel rested, and has trouble getting back to sleep. She feels her body tenses up along with palpitations. She's tried valerian root tea for some relief. She informs that her hands been hurting and also some chest pain, but the chest pain only occurs when she's anxious. It worries her when her body is awake but her mind wants to sleep. She denies self injury or SI.   Stress She's been taking care of her older daughter who has autism. About 3 years ago, she felt like she's lost some control with this. She doesn't feel like anxiety is coming back and feel like it's just problem with sleep.   Medication She's been off of her zoloft for about a year now because she thought she should change her habits for energy. She denies having any relief with the ativan.    Personal She has children (70, 77 and 56 years old) at home.  She's been going to [employer] for learning English.   Interpreter was called with understanding expressed.   Patient Active Problem List   Diagnosis Date Noted  . Anxiety and depression 03/04/2013   Past Medical History  Diagnosis Date  . Anxiety   . Depression    No past surgical history on file. No Known Allergies Prior to Admission medications   Not on File   Social History   Social History  . Marital Status: Married    Spouse Name: N/A  . Number of Children: N/A  . Years of Education: N/A   Occupational History  . Not on file.   Social History Main Topics  . Smoking status: Never Smoker   . Smokeless tobacco: Not on file  . Alcohol Use: Yes  . Drug Use: No  . Sexual Activity: Not on file   Other Topics Concern  . Not on file   Social History Narrative   Review of Systems  Constitutional: Positive for fatigue. Negative for fever, chills and diaphoresis.  Respiratory: Positive for chest tightness. Negative for shortness of breath.   Cardiovascular: Negative for chest pain.  Gastrointestinal: Negative for nausea.  Musculoskeletal: Positive for myalgias and neck stiffness.  Psychiatric/Behavioral: Positive for sleep disturbance. Negative for suicidal ideas and self-injury. The patient is nervous/anxious.  Objective:   Physical Exam  Constitutional: She is oriented to person, place, and time. She appears well-developed and well-nourished. No distress.  HENT:  Head: Normocephalic and atraumatic.  Eyes: EOM are normal. Pupils are equal, round, and reactive to light.  Neck: Neck supple.  Cardiovascular: Normal rate.   Pulmonary/Chest: Effort normal. No respiratory distress.  Musculoskeletal: Normal range of motion.  Neurological: She is alert and oriented to person, place, and time.  Skin: Skin is warm and dry.  Psychiatric: She has a normal mood and affect. Her behavior is normal.  Nursing  note and vitals reviewed.   Filed Vitals:   04/12/15 1650  BP: 108/69  Pulse: 72  Temp: 98.4 F (36.9 C)  TempSrc: Oral  Resp: 20  Height: '4\' 11"'  (1.499 m)  Weight: 131 lb 6.4 oz (59.603 kg)  SpO2: 99%      Assessment & Plan:   Amy Tyler is a 35 y.o. female Insomnia - Plan: clonazePAM (KLONOPIN) 0.5 MG tablet  Psychosocial stressors - Plan: clonazePAM (KLONOPIN) 0.5 MG tablet  History of anxiety - Plan: clonazePAM (KLONOPIN) 0.5 MG tablet  Language barrier interpreting service used.  Suspected psychosocial stressors with daughter with autism spectrum disorder, and may have some underlying anxiety on top of her current stressors. Stress and stress management discussed with handout,  Phone numbers of counselors given, trial of Klonopin at night, or during day if needed for breakthrough anxiety symptoms. Considering restarting SSRI, but can see how she does with above treatment for the next few weeks. Follow-up with Rosario Adie, PA-C in the next 2-3 weeks.  Sooner if worse.   Meds ordered this encounter  Medications  . clonazePAM (KLONOPIN) 0.5 MG tablet    Sig: Take 1 tablet (0.5 mg total) by mouth at bedtime as needed for anxiety. Label in spanish    Dispense:  20 tablet    Refill:  0   Patient Instructions  Klonopin un pastilla cada noche si necesario. otra informacion abajo y numeros si necesario  regrese en 2-3 semanas hablar a Jaynee Eagles, [place]. Mas temprano si empeorse.   Pryor Montes: [phone] Royetta Crochet: [phone]  Levester Fresh y control del estrs (Stress and Stress Management) El estrs es una reaccin normal a los sucesos de la vida. Es lo que se siente cuando la vida le exige ms de lo que est acostumbrado o ms de lo que puede [place]. Un poco de [employer] ser til. Por ejemplo, la reaccin al estrs puede ayudarlo a tomar el ltimo autobs del da, a estudiar para un examen o a cumplir con un plazo lmite en el trabajo. Sin embargo, el Retail buyer  frecuente o que dura mucho tiempo puede causar problemas. Puede afectar la salud emocional y [place] y las actividades cotidianas normales. El exceso de estrs puede Academic librarian sistema inmunitario y aumentar el riesgo de que tenga enfermedades fsicas. Si ya tiene un problema mdico, el estrs puede empeorarlo. CAUSAS  [employer] sucesos de la vida pueden causar estrs. Un suceso que le causa estrs a una persona puede no ser estresante para Theatre manager. Generalmente, los sucesos importantes de la vida causan estrs. Estos pueden ser positivos o negativos, por ejemplo, quedarse sin empleo, mudarse a una casa nueva, casarse, tener un beb o perder a un ser querido. Los sucesos de la vida menos evidentes tambin pueden causar estrs, especialmente si ocurren da tras da o en combinacin. Por ejemplo, trabajar muchas horas, conducir cuando hay mucho trnsito,  cuidar a los nios, tener deudas o tener una relacin difcil. SIGNOS Y SNTOMAS El estrs puede causar sntomas emocionales que incluyen lo siguiente:  Ansiedad. Sentirse preocupado, temeroso, nervioso, abrumado o fuera de control.  Enojo. Sentirse irritado o impaciente.  Depresin. Sentirse triste, decado, desesperanzado o culpable.  Dificultad para concentrarse, recordar o tomar decisiones. El estrs puede causar sntomas fsicos que incluyen lo siguiente:   Dolores y [place]. Estos pueden afectar la cabeza, el cuello, la espalda, el estmago u otras zonas del cuerpo.  Rigidez muscular o tensin mandibular.  Poca energa o dificultad para dormir. El estrs puede provocar conductas poco saludables, que incluyen lo siguiente:   Comer para sentirse mejor (comer en exceso) o IT consultant comidas.  Dormir [employer], mucho o ambas cosas.  Trabajar mucho o postergar tareas (posponer).  Fumar, beber alcohol o consumir drogas para sentirse mejor. DIAGNSTICO  El estrs se diagnostica a travs de una evaluacin que realiza el  mdico. El mdico le har preguntas sobre los sntomas o cualquier suceso estresante de la vida. Adems, el mdico le har preguntas sobre sus antecedentes mdicos y tal vez indique anlisis de Force u otros estudios. Ciertas afecciones y algunos medicamentos pueden provocar sntomas fsicos similares a los del estrs. Las enfermedades mentales pueden causar sntomas emocionales y Actor conductas poco sanas similares a los del estrs. El mdico podr derivarlo a un profesional de la salud mental para que le realice una evaluacin ms profunda.  TRATAMIENTO  El tratamiento recomendado para el estrs es el control del estrs. Los [employer] H[employer]away del control del estrs son reducir los eventos estresantes de la vida y enfrentar el estrs de maneras saludables.  Las tcnicas para reducir los eventos estresantes de la vida incluyen lo siguiente:  Identificacin del estrs. Autocontrolar el estrs e identificar qu lo causa. Estas habilidades pueden ayudarlo a evitar algunos sucesos estresantes.  Control del tiempo. Establecer las prioridades, llevar un calendario de los sucesos y aprender a Software engineer "no". Estas herramientas pueden ayudarlo a evitar que asuma muchos compromisos. Las tcnicas para lidiar con el estrs incluyen lo siguiente:  Replantearse el problema. Tratar de pensar de un modo realista los eventos estresantes en lugar de ignorarlos o Horticulturist, commercial. Intente encontrar los aspectos positivos en una situacin estresante, en lugar de enfocarse en los negativos.  La actividad fsica. El ejercicio fsico puede liberar la tensin fsica y Architectural technologist. La clave es encontrar un tipo de ejercicio fsico que disfrute y practique con regularidad.  Tcnicas de relajacin. Estas relajan el cuerpo y la [place]. Los ejemplos son el yoga, la meditacin, el tai chi, la biorregulacin, la respiracin profunda, la relajacin muscular progresiva, Conservation officer, nature, estar al [employer] en contacto con la  naturaleza, llevar un diario y otros pasatiempos. Nuevamente, la clave es encontrar una o ms opciones que disfrute y Hydrographic surveyor con regularidad.  Estilo de vida saludable. Coma una dieta equilibrada, duerma mucho y no fume. Evite el consumo de alcohol o de drogas para relajarse.  Red de apoyo slida. Pase tiempo con la familia, los amigos y otras personas cuya compaa disfrute. Exprese sus sentimientos y converse acerca de las cosas que le preocupan con alguien en quien confe. La orientacin o la psicoterapiacon un profesional de la salud mental pueden ser de ayuda si tiene dificultades para controlar el estrs por s solo. Generalmente, no se recomiendan medicamentos para el tratamiento del estrs. Hable con el mdico si considera que necesita medicamentos para los sntomas de estrs. INSTRUCCIONES PARA EL  CUIDADO EN EL HOGAR  Concurra a todas las visitas de control como se lo haya indicado el mdico.  Tome todos los [employer] se lo haya indicado el mdico. SOLICITE ATENCIN MDICA SI:  Los sntomas empeoran o empieza a tener sntomas nuevos.  Se siente abrumado por los problemas y ya no puede manejarlos solo. SOLICITE ATENCIN MDICA DE INMEDIATO SI:  Siente deseos de lastimarse o lastimar a Nurse, children's.   Esta informacin no tiene Marine scientist el consejo del mdico. Asegrese de hacerle al mdico cualquier pregunta que tenga.   Document Released: 11/23/2004 Document Revised: 03/06/2014 Elsevier Interactive Patient Education 2016 [place] (Insomnia) El insomnio es un trastorno del sueo que causa dificultades para conciliar el sueo o para [place]. Puede producir cansancio (fatiga), falta de energa, dificultad para concentrarse, cambios en el estado de nimo y mal rendimiento escolar o laboral.  Hay tres formas diferentes de clasificar el insomnio:   Dificultad para conciliar el sueo.  Dificultad para mantener el sueo.  Despertar  muy precoz por la maana. Cualquier tipo de insomnio puede ser a Barrister's clerk (crnico) o a Control and instrumentation engineer (agudo). Ambos son frecuentes. Generalmente, el insomnio a corto plazo dura tres meses o menos tiempo. El crnico ocurre al menos tres veces por semana durante ms de tres meses. CAUSAS  El insomnio puede deberse a otra afeccin, situacin o sustancia, por ejemplo:  Ansiedad.  Algunos medicamentos.  Enfermedad por reflujo gastroesofgico (ERGE) u otras enfermedades gastrointestinales.  Asma y otras enfermedades respiratorias.  Sndrome de las piernas inquietas, apnea del sueo u otros trastornos del sueo.  Dolor crnico.  Menopausia, que puede incluir calores repentinos.  Ictus.  Consumo excesivo de alcohol, tabaco u drogas ilegales.  Depresin.  Cafena.  Trastornos neurolgicos, como enfermedad de Alzheimer.  Hiperactividad tiroidea (hipertiroidismo). Es posible que la causa del insomnio no se conozca. FACTORES DE RIESGO Los factores de riesgo de tener insomnio incluyen lo siguiente:  El sexo. La mujeres se ven ms afectadas que los hombres.  La edad. El insomnio es ms frecuente a medida que una persona envejece.  El estrs. Esto puede incluir su vida profesional o personal.  Los ingresos. El insomnio es ms frecuente en las personas cuyos ingresos son ms bajos.  La falta de actividad fsica.  Los horarios de trabajo irregulares o los turnos nocturnos.  Los viajes a lugares de diferentes zonas horarias. SIGNOS Y SNTOMAS Si tiene insomnio, el sntoma principal es la dificultad para conciliar el sueo o mantenerlo. Esto puede derivar en otros sntomas, por ejemplo:  Sentirse fatigado.  Ponerse nervioso por [employer] irse a dormir.  No sentirse descansado por la maana.  Tener dificultad para concentrarse.  Sentirse irritable, ansioso o deprimido. TRATAMIENTO  El tratamiento para el insomnio depende de la causa. Si se debe a una enfermedad preexistente,  el tratamiento se centrar en el abordaje de la enfermedad. El tratamiento tambin puede incluir lo siguiente:   Medicamentos que lo ayuden a dormir.  Asesoramiento psicolgico o terapia.  Cambios en el estilo de vida. Walls los medicamentos solamente como se lo haya indicado el mdico.  Establezca horarios habituales para dormir y Clinical cytogeneticist. No tome siestas.  Lleve un registro del sueo ya que podra ser de utilidad para que usted y a su mdico puedan determinar qu podra estar causndole insomnio. Incluya lo siguiente:  Cundo duerme.  Cundo se despierta durante la noche.  Qu tan  bien duerme.  Qu tan relajado se siente al da siguiente.  Cualquier efecto secundario de los medicamentos que toma.  Lo que usted come y bebe.  Convierta a su habitacin en un lugar cmodo donde sea fcil conciliar el sueo:  Coloque persianas o cortinas especiales oscuras que impidan la entrada de la luz del exterior.  Para bloquear los ruidos, use un aparato que reproduce sonidos ambientales o relajantes de fondo.  Mantenga baja la temperatura.  Haga ejercicio regularmente como se lo haya indicado el mdico. No haga ejercicio justo antes de la hora de [place].  Utilice tcnicas de relajacin para controlar el estrs. Pdale al mdico que le sugiera algunas tcnicas que sean adecuadas para usted. Estos pueden incluir lo siguiente:  Ejercicios de respiracin.  Rutinas para aliviar la tensin muscular.  Visualizacin de escenas apacibles.  Disminucin del consumo de alcohol, bebidas con cafena y cigarrillos, especialmente cerca de la hora de [place], ya que pueden perturbarle el sueo.  No coma en exceso ni consuma comidas picantes justo antes de la hora de [place]. Esto puede causarle molestias digestivas y dificultades para dormir.  Limite el uso de pantallas antes de la hora de [place]. Esto incluye lo siguiente:  Mirar  televisin.  Usar el telfono inteligente, la tableta y la computadora.  Siga una rutina. Esto puede ayudarlo a conciliar el sueo ms rpidamente. Intente hacer una actividad tranquila, cepillarse los dientes e irse a la cama a la misma hora todas las noches.  Levntese de la cama si sigue despierto despus de 19mnutos de haber intentado dormirse. MStearnsluces, pero intente leer o hacer una actividad tranquila. Cuando tenga sueo, regrese a lFutures trader  Conduzca con cuidado. No conduzca si est muy somnoliento.  Concurra a todas las visitas de control, segn le indique su mdico. Esto es importante. SOLICITE ATENCIN MDICA SI:   Est cansado durante el da o tiene dificultades en su rutina diaria debido a la somnolencia.  Sigue teniendo problemas para dormir o ePress photographer SOLICITE ATENCIN MDICA DE INMEDIATO SI:   Tiene pensamientos serios acerca de lastimarse a usted mismo o daar a oNurse, children's   Esta informacin no tiene cMarine scientistel consejo del mdico. Asegrese de hacerle al mdico cualquier pregunta que tenga.   Document Released: 02/13/2005 Document Revised: 03/06/2014 Elsevier Interactive Patient Education 2[employer]      I personally performed the services described in this documentation, which was scribed in my presence. The recorded information has been reviewed and considered, and addended by me as needed.

## 2015-07-06 ENCOUNTER — Ambulatory Visit: Payer: Self-pay | Attending: Internal Medicine

## 2015-07-20 ENCOUNTER — Ambulatory Visit: Payer: Self-pay | Attending: Internal Medicine | Admitting: Physician Assistant

## 2015-07-20 ENCOUNTER — Encounter: Payer: Self-pay | Admitting: Physician Assistant

## 2015-07-20 VITALS — BP 114/73 | HR 97 | Temp 98.1°F | Resp 16 | Ht 59.0 in | Wt 130.6 lb

## 2015-07-20 DIAGNOSIS — Z8659 Personal history of other mental and behavioral disorders: Secondary | ICD-10-CM

## 2015-07-20 DIAGNOSIS — Z658 Other specified problems related to psychosocial circumstances: Secondary | ICD-10-CM

## 2015-07-20 DIAGNOSIS — F411 Generalized anxiety disorder: Secondary | ICD-10-CM

## 2015-07-20 DIAGNOSIS — G4700 Insomnia, unspecified: Secondary | ICD-10-CM

## 2015-07-20 MED ORDER — BUSPIRONE HCL 10 MG PO TABS: 10.0000 mg | ORAL_TABLET | Freq: Two times a day (BID) | ORAL | Status: DC

## 2015-07-20 MED ORDER — CLONAZEPAM 0.5 MG PO TABS: 0.5000 mg | ORAL_TABLET | Freq: Every evening | ORAL | Status: DC | PRN

## 2015-07-20 MED ORDER — ZOLPIDEM TARTRATE 5 MG PO TABS: 5.0000 mg | ORAL_TABLET | Freq: Every evening | ORAL | Status: DC | PRN

## 2015-07-20 MED FILL — busPIRone HCL 10 MG TABS: 10 | 30 days supply | Qty: 60 | Fill #0

## 2015-07-20 MED FILL — clonazePAM 0.5 MG TABS: 0.5 | 20 days supply | Qty: 20 | Fill #0

## 2015-07-20 MED FILL — ZOLPIDEM TARTRATE 5 MG TAB: 5 | 15 days supply | Qty: 15 | Fill #0

## 2015-07-20 NOTE — Patient Instructions (Signed)
[facility] as [facility]

## 2015-07-20 NOTE — Progress Notes (Signed)
Pt came in with swelling in both knees that has been present for months.  Pt has also noticed cracking in her knees for the last 2 months.  Pt reports that this is worse when going upstairs.  Pt also reports having anxiety which causes her to be unable to sleep at night.  Pt reports that during the day she feels worried that something will happen and her heart races.

## 2015-07-20 NOTE — Progress Notes (Signed)
Chief Complaint: "I cant sleep; I feel very anxious"  Subjective: 35 yo female presents with difficulty sleeping for months. Tough time falling asleep. Feels that she is never at peace. She sees every hour go by. Does not work. She is in school at [employer].  Has tried Unisom and other OTC meds without success. Has tried music and reading etc. She states she feels a "since of impending doom" "like something is going to happen".   ROS:  GEN: denies fever or chills, denies change in weight Skin: denies lesions or rashes HEENT: denies headache, earache, epistaxis, sore throat, or neck pain LUNGS: denies SHOB, dyspnea, PND, orthopnea CV: denies CP or palpitations ABD: denies abd pain, N or V EXT: denies muscle spasms or swelling; no pain in lower ext, no weakness NEURO: denies numbness or tingling, denies sz, stroke or TIA   Objective:  Filed Vitals:   07/20/15 1117  BP: 114/73  Pulse: 97  Temp: 98.1 F (36.7 C)  TempSrc: Oral  Resp: 16  Height: 4\' 11"  (1.499 m)  Weight: 130 lb 9.6 oz (59.24 kg)  SpO2: 100%    Physical Exam:Benign  General: in no acute distress. HEENT: no pallor, no icterus, moist oral mucosa, no JVD, no lymphadenopathy Heart: Normal  s1 &s2  Regular rate and rhythm, without murmurs, rubs, gallops. Lungs: Clear to auscultation bilaterally. Abdomen: Soft, nontender, nondistended, positive bowel sounds. Extremities: No clubbing cyanosis or edema with positive pedal pulses. Neuro: Alert, awake, oriented x3, nonfocal.    Medications: Prior to Admission medications   Medication Sig Start Date End Date Taking? Authorizing Provider  busPIRone (BUSPAR) 10 MG tablet Take 1 tablet (10 mg total) by mouth 2 (two) times daily. 07/20/15   Athol Bolds Netta CedarsS Bernisha Verma, PA-C  clonazePAM (KLONOPIN) 0.5 MG tablet Take 1 tablet (0.5 mg total) by mouth at bedtime as needed for anxiety. Label in spanish 07/20/15   Vivianne Masteriffany S Ezekiel Menzer, PA-C  zolpidem (AMBIEN) 5 MG tablet Take 1 tablet (5 mg total) by  mouth at bedtime as needed for sleep. 07/20/15   Vivianne Masteriffany S Symeon Puleo, PA-C    Assessment: 1. Generalized Anxiety D/O 2. Insomnia    Plan: Buspar Prn Klonopin Ambien as needed/Melatonin OTC prn 30 min face to face time spent with use of Spanish [employer]  Follow up:4 weeks with Dr. Hyman HopesJegede  The patient was given clear instructions to go to ER or return to medical center if symptoms don't improve, worsen or new problems develop. The patient verbalized understanding. The patient was told to call to get lab results if they haven't heard anything in the next week.   This note has been created with Education officer, environmentalDragon speech recognition software and smart phrase technology. Any transcriptional errors are unintentional.   Scot Juniffany Tamica Covell, PA-C 07/20/2015, 12:47 PM

## 2015-09-03 MED FILL — busPIRone HCL 10 MG TABS: 10 | 30 days supply | Qty: 60 | Fill #1

## 2015-10-01 ENCOUNTER — Other Ambulatory Visit: Payer: Self-pay | Admitting: Physician Assistant

## 2015-10-01 MED FILL — busPIRone HCL 10 MG TABS: 10 | 30 days supply | Qty: 60 | Fill #0

## 2015-10-06 ENCOUNTER — Encounter: Payer: Self-pay | Admitting: Family Medicine

## 2015-10-06 ENCOUNTER — Ambulatory Visit: Payer: Self-pay | Attending: Family Medicine | Admitting: Family Medicine

## 2015-10-06 VITALS — BP 98/63 | HR 95 | Temp 98.7°F | Ht 59.0 in | Wt 128.2 lb

## 2015-10-06 DIAGNOSIS — M238X1 Other internal derangements of right knee: Secondary | ICD-10-CM

## 2015-10-06 DIAGNOSIS — F32A Depression, unspecified: Secondary | ICD-10-CM

## 2015-10-06 DIAGNOSIS — F418 Other specified anxiety disorders: Secondary | ICD-10-CM | POA: Insufficient documentation

## 2015-10-06 DIAGNOSIS — F419 Anxiety disorder, unspecified: Secondary | ICD-10-CM

## 2015-10-06 DIAGNOSIS — F515 Nightmare disorder: Secondary | ICD-10-CM

## 2015-10-06 DIAGNOSIS — M2391 Unspecified internal derangement of right knee: Secondary | ICD-10-CM

## 2015-10-06 DIAGNOSIS — F329 Major depressive disorder, single episode, unspecified: Secondary | ICD-10-CM

## 2015-10-06 LAB — COMPLETE METABOLIC PANEL WITH GFR
ALT: 42 U/L — AB (ref 6–29)
AST: 26 U/L (ref 10–30)
Albumin: 4 g/dL (ref 3.6–5.1)
Alkaline Phosphatase: 66 U/L (ref 33–115)
BUN: 10 mg/dL (ref 7–25)
CALCIUM: 9.3 mg/dL (ref 8.6–10.2)
CHLORIDE: 103 mmol/L (ref 98–110)
CO2: 26 mmol/L (ref 20–31)
Creat: 0.61 mg/dL (ref 0.50–1.10)
Glucose, Bld: 80 mg/dL (ref 65–99)
POTASSIUM: 3.9 mmol/L (ref 3.5–5.3)
Sodium: 137 mmol/L (ref 135–146)
Total Bilirubin: 1.2 mg/dL (ref 0.2–1.2)
Total Protein: 7 g/dL (ref 6.1–8.1)

## 2015-10-06 LAB — TSH: TSH: 1.14 m[IU]/L

## 2015-10-06 MED ORDER — CLONIDINE HCL 0.1 MG PO TABS: 0.1000 mg | ORAL_TABLET | Freq: Every day | ORAL | 1 refills | Status: DC

## 2015-10-06 MED ORDER — BUSPIRONE HCL 10 MG PO TABS: 10.0000 mg | ORAL_TABLET | Freq: Two times a day (BID) | ORAL | 1 refills | Status: DC

## 2015-10-06 MED FILL — ?CLONIDINE HCL 0.1 MG TABL: 0.1 | 30 days supply | Qty: 30 | Fill #0

## 2015-10-06 NOTE — Patient Instructions (Signed)
Trastorno de ansiedad generalizada (Generalized Anxiety Disorder) El trastorno de ansiedad generalizada es un trastorno mental. Interfiere en las funciones vitales, incluyendo las relaciones, el trabajo y la escuela.  Es diferente de la ansiedad normal que todas las personas experimentan en algn momento de su vida en respuesta a sucesos y actividades especficas. En verdad, la ansiedad normal nos ayuda a prepararnos y atravesar estos acontecimientos y actividades de la vida. La ansiedad normal desaparece despus de que el evento o la actividad ha finalizado.  El trastorno de ansiedad generalizada no est necesariamente relacionada con eventos o actividades especficas. Tambin causa un exceso de ansiedad en proporcin a sucesos o actividades especficas. En este trastorno la ansiedad es difcil de controlar. Los sntomas pueden variar de leves a muy graves. Las personas que sufren de trastorno de ansiedad generalizada pueden tener intensas olas de ansiedad con sntomas fsicos (ataques de pnico).  SNTOMAS  La ansiedad y la preocupacin asociada a este trastorno son difciles de controlar. Esta ansiedad y la preocupacin estn relacionados con muchos eventos de la vida y sus actividades y tambin ocurre durante ms das de los que no ocurre, durante 6 meses o ms. Las personas que la sufren pueden tener tres o ms de los siguientes sntomas (uno o ms en los nios):   Agitacin   Fatiga.  Dificultades de concentracin.   Irritabilidad.  Tensin muscular  Dificultad para dormirse o sueo poco satisfactorio. DIAGNSTICO  Se diagnostica a travs de una evaluacin realizada por el mdico. El mdico le har preguntas acerca de su estado de nimo, sntomas fsicos y sucesos de su vida. Le har preguntas sobre su historia clnica, el consumo de alcohol o drogas, incluyendo los medicamentos recetados. Tambin le har un examen fsico e indicar anlisis de sangre. Ciertas enfermedades y el uso de  determinadas sustancias pueden causar sntomas similares a este trastorno. Su mdico lo puede derivar a un especialista en salud mental para una evaluacin ms profunda..  TRATAMIENTO  Las terapias siguientes se utilizan en el tratamiento de este trastorno:   Medicamentos - Se recetan antidepresivos para el control diario a largo plazo. Pueden indicarse tambin medicamentos para combatir la ansiedad en los casos graves, especialmente cuando ocurren ataques de pnico.   Terapia conversada (psicoterapia) Ciertos tipos de psicoterapia pueden ser tiles en el tratamiento del trastorno de ansiedad generalizada, proporcionando apoyo, educacin y orientacin. Una forma de psicoterapia llamada terapia cognitivo-conductual puede ensearle formas saludables de pensar y reaccionar a los eventos y actividades de la vida diaria.  Tcnicasde manejo del estrs- Estas tcnicas incluyen el yoga, la meditacin y el ejercicio y pueden ser muy tiles cuando se practican con regularidad. Un especialista en salud mental puede ayudar a determinar qu tratamiento es mejor para usted. Algunas personas obtienen mejora con una terapia. Sin embargo, otras personas requieren una combinacin de terapias.    Esta informacin no tiene como fin reemplazar el consejo del mdico. Asegrese de hacerle al mdico cualquier pregunta que tenga.   Document Released: 06/10/2012 Document Revised: 03/06/2014 Elsevier Interactive Patient Education 2016 Elsevier Inc.  

## 2015-10-06 NOTE — Progress Notes (Signed)
Subjective:  Patient ID: Nykerria Macconnell, female    DOB: [birth year]  Age: 35 y.o. MRN: [id number]  CC: Anxiety ("much better")   HPI Syona Bobbye Riggs is a 35 year old female with a history of generalized anxiety disorder who presents today for follow-up visit. At her last office visit she had complained of insomnia and had been placed on Ambien and Klonopin which she states did help her symptoms. She complains of waking up at 2- 3 AM feeling tense in her neck and admitting to having a lot of nightmares. Endorses anxiety and states she feels like her knees "crack" when she has anxiety symptoms.  Past Medical History:  Diagnosis Date  . Anxiety   . Depression     History reviewed. No pertinent surgical history.   Outpatient Medications Prior to Visit  Medication Sig Dispense Refill  . busPIRone (BUSPAR) 10 MG tablet TAKE 1 TABLET BY MOUTH TWICE DAILY. 60 tablet 0  . clonazePAM (KLONOPIN) 0.5 MG tablet Take 1 tablet (0.5 mg total) by mouth at bedtime as needed for anxiety. Label in spanish 20 tablet 0  . zolpidem (AMBIEN) 5 MG tablet Take 1 tablet (5 mg total) by mouth at bedtime as needed for sleep. (Patient not taking: Reported on 10/06/2015) 15 tablet 1   No facility-administered medications prior to visit.     ROS Review of Systems  Constitutional: Negative for activity change, appetite change and fatigue.  HENT: Negative for congestion, sinus pressure and sore throat.   Eyes: Negative for visual disturbance.  Respiratory: Negative for cough, chest tightness, shortness of breath and wheezing.   Cardiovascular: Negative for chest pain and palpitations.  Gastrointestinal: Negative for abdominal distention, abdominal pain and constipation.  Endocrine: Negative for polydipsia.  Genitourinary: Negative for dysuria and frequency.  Musculoskeletal: Negative for arthralgias and back pain.  Skin: Negative for rash.  Neurological: Negative for tremors, light-headedness and  numbness.  Hematological: Does not bruise/bleed easily.  Psychiatric/Behavioral: Positive for sleep disturbance. Negative for agitation and behavioral problems. The patient is nervous/anxious.     Objective:  BP 98/63 (BP Location: Right Arm, Patient Position: Sitting, Cuff Size: Small)   Pulse 95   Temp 98.7 F (37.1 C) (Oral)   Ht  (1.499 m)   Wt 128 lb 3.2 oz (58.2 kg)   SpO2 99%   BMI 25.89 kg/m   BP/Weight 10/06/2015 07/20/2015 04/12/2015  Systolic BP 98 114 108  Diastolic BP 63 73 69  Wt. (Lbs) 128.2 130.6 131.4  BMI 25.89 26.36 26.53      Physical Exam  Constitutional: She is oriented to person, place, and time. She appears well-developed and well-nourished.  Cardiovascular: Normal rate, normal heart sounds and intact distal pulses.   No murmur heard. Pulmonary/Chest: Effort normal and breath sounds normal. She has no wheezes. She has no rales. She exhibits no tenderness.  Abdominal: Soft. Bowel sounds are normal. She exhibits no distension and no mass. There is no tenderness.  Musculoskeletal: Normal range of motion.  Neurological: She is alert and oriented to person, place, and time.  Psychiatric:  Nervous     Assessment & Plan:   1. Anxiety and depression Has not been taking BuSpar which I have refilled today - busPIRone (BUSPAR) 10 MG tablet; Take 1 tablet (10 mg total) by mouth 2 (two) times daily.  Dispense: 60 tablet; Refill: 1 - TSH - COMPLETE METABOLIC PANEL WITH GFR  2. Nightmares This explains insomnia which she does have. Discussed habit forming nature of Ambien  and Klonopin given her young age Trial of clonidine and will add trazodone if needed. - cloNIDine (CATAPRES) 0.1 MG tablet; Take 1 tablet (0.1 mg total) by mouth at bedtime.  Dispense: 30 tablet; Refill: 1  3. Knee crepitus, right She has no pain She describes this as related to previous when she has anxiety and we cannot exclude somatic symptoms at this time.   Meds ordered this  encounter  Medications  . busPIRone (BUSPAR) 10 MG tablet    Sig: Take 1 tablet (10 mg total) by mouth 2 (two) times daily.    Dispense:  60 tablet    Refill:  1    Must have office visit for refills  . cloNIDine (CATAPRES) 0.1 MG tablet    Sig: Take 1 tablet (0.1 mg total) by mouth at bedtime.    Dispense:  30 tablet    Refill:  1    Follow-up: Return in about 3 weeks (around 10/27/2015) for follow up on anxiety with PCP- Dr Hyman HopesJegede.   Jaclyn ShaggyEnobong Amao MD

## 2015-10-06 NOTE — Progress Notes (Signed)
No longer has [place] and taking klonopin sparingly Me refills-buspar, Remus Lofflerambien

## 2015-10-13 ENCOUNTER — Ambulatory Visit: Payer: Self-pay | Attending: Internal Medicine

## 2015-10-15 NOTE — Progress Notes (Signed)
Telephone call to [employer] at [phone] assistance received from interpreter ID [id number]. Patient hipaa verified.  Patient advised Per Dr. Venetia NightAmao "Normal thyroid labs; very slight liver enzyme elevation of unknown etiology, will monitor". Patient verbalized understanding. Pollyann KennedyKim Becton, RN, BSN

## 2015-10-25 MED FILL — ZOLPIDEM TARTRATE 5 MG TAB: 5 | 15 days supply | Qty: 15 | Fill #1

## 2015-10-27 ENCOUNTER — Encounter: Payer: Self-pay | Admitting: Family Medicine

## 2015-10-27 ENCOUNTER — Ambulatory Visit: Payer: Self-pay | Attending: Family Medicine | Admitting: Family Medicine

## 2015-10-27 VITALS — BP 104/60 | HR 75 | Temp 98.0°F | Resp 16 | Wt 129.0 lb

## 2015-10-27 DIAGNOSIS — F329 Major depressive disorder, single episode, unspecified: Secondary | ICD-10-CM

## 2015-10-27 DIAGNOSIS — G4700 Insomnia, unspecified: Secondary | ICD-10-CM

## 2015-10-27 DIAGNOSIS — Z0000 Encounter for general adult medical examination without abnormal findings: Secondary | ICD-10-CM

## 2015-10-27 DIAGNOSIS — F515 Nightmare disorder: Secondary | ICD-10-CM

## 2015-10-27 DIAGNOSIS — F418 Other specified anxiety disorders: Secondary | ICD-10-CM

## 2015-10-27 DIAGNOSIS — F419 Anxiety disorder, unspecified: Principal | ICD-10-CM

## 2015-10-27 MED ORDER — CLONIDINE HCL 0.1 MG PO TABS: 0.1000 mg | ORAL_TABLET | Freq: Every day | ORAL | 3 refills | Status: DC

## 2015-10-27 MED ORDER — BUSPIRONE HCL 10 MG PO TABS: 10.0000 mg | ORAL_TABLET | Freq: Two times a day (BID) | ORAL | 3 refills | Status: DC

## 2015-10-27 MED ORDER — TRAZODONE HCL 100 MG PO TABS: 100.0000 mg | ORAL_TABLET | Freq: Every day | ORAL | 3 refills | Status: DC

## 2015-10-27 MED FILL — ?TRAZODONE 100 MG TABLET: 100 | 30 days supply | Qty: 30 | Fill #0

## 2015-10-27 NOTE — Progress Notes (Signed)
Subjective:  Patient ID: Amy Tyler, female    DOB: [birth year]  Age: 35 y.o. MRN: [id number]  CC: Anxiety   HPI Amy Tyler is a 35 year old female with a history of anxiety, insomnia whom I had seen for the first time at her last office visit and had resumed BuSpar for anxiety prior to which she has been taking Klonopin and Ambien which I had discontinued.  She states daytime anxiety symptoms are controlled however at night she cannot get herself to fall asleep despite taking Clonidine and she does have nightmares. States her neck feels tight at night and she keeps waking up several times. She has had anxiety for the last 3 years intermittently but symptoms have worsened over the last 6 weeks.  The knee crepitus she had complained about at her last visit has resolved.  Outpatient Medications Prior to Visit  Medication Sig Dispense Refill  . busPIRone (BUSPAR) 10 MG tablet Take 1 tablet (10 mg total) by mouth 2 (two) times daily. 60 tablet 1  . cloNIDine (CATAPRES) 0.1 MG tablet Take 1 tablet (0.1 mg total) by mouth at bedtime. 30 tablet 1   No facility-administered medications prior to visit.     ROS Review of Systems  Constitutional: Negative for activity change, appetite change and fatigue.  HENT: Negative for congestion, sinus pressure and sore throat.   Eyes: Negative for visual disturbance.  Respiratory: Negative for cough, chest tightness, shortness of breath and wheezing.   Cardiovascular: Negative for chest pain and palpitations.  Gastrointestinal: Negative for abdominal distention, abdominal pain and constipation.  Endocrine: Negative for polydipsia.  Genitourinary: Negative for dysuria and frequency.  Musculoskeletal: Positive for neck pain. Negative for arthralgias and back pain.  Skin: Negative for rash.  Neurological: Negative for tremors, light-headedness and numbness.  Hematological: Does not bruise/bleed easily.  Psychiatric/Behavioral: Positive for  sleep disturbance. Negative for agitation and behavioral problems. The patient is nervous/anxious.     Objective:  BP 104/60 (BP Location: Right Arm, Patient Position: Sitting, Cuff Size: Normal)   Pulse 75   Temp 98 F (36.7 C) (Oral)   Resp 16   Wt 129 lb (58.5 kg)   SpO2 98%   BMI 26.05 kg/m   BP/Weight 10/27/2015 10/06/2015 07/20/2015  Systolic BP 104 98 114  Diastolic BP 60 63 73  Wt. (Lbs) 129 128.2 130.6  BMI 26.05 25.89 26.36      Physical Exam  Constitutional: She is oriented to person, place, and time. She appears well-developed and well-nourished.  Cardiovascular: Normal rate, normal heart sounds and intact distal pulses.   No murmur heard. Pulmonary/Chest: Effort normal and breath sounds normal. She has no wheezes. She has no rales. She exhibits no tenderness.  Abdominal: Soft. Bowel sounds are normal. She exhibits no distension and no mass. There is no tenderness.  Musculoskeletal: Normal range of motion.  Neurological: She is alert and oriented to person, place, and time.     Assessment & Plan:   1. Anxiety and depression Symptoms are worse at night If symptoms persist I will refer her to psych to be evaluated for possible mood disorder. - busPIRone (BUSPAR) 10 MG tablet; Take 1 tablet (10 mg total) by mouth 2 (two) times daily.  Dispense: 60 tablet; Refill: 3  2. Insomnia Continue clonidine and I will add trazodone to her regimen - traZODone (DESYREL) 100 MG tablet; Take 1 tablet (100 mg total) by mouth at bedtime.  Dispense: 30 tablet; Refill: 3  3. Nightmares - cloNIDine (  CATAPRES) 0.1 MG tablet; Take 1 tablet (0.1 mg total) by mouth at bedtime.  Dispense: 30 tablet; Refill: 3   Meds ordered this encounter  Medications  . busPIRone (BUSPAR) 10 MG tablet    Sig: Take 1 tablet (10 mg total) by mouth 2 (two) times daily.    Dispense:  60 tablet    Refill:  3  . traZODone (DESYREL) 100 MG tablet    Sig: Take 1 tablet (100 mg total) by mouth at bedtime.     Dispense:  30 tablet    Refill:  3  . cloNIDine (CATAPRES) 0.1 MG tablet    Sig: Take 1 tablet (0.1 mg total) by mouth at bedtime.    Dispense:  30 tablet    Refill:  3    Follow-up: Return in about 6 weeks (around 12/08/2015) for follow up on anxiety and insomnia.   Jaclyn Shaggy MD

## 2015-10-27 NOTE — Progress Notes (Signed)
Anxiety symptoms have increased the past month; hx anxiety x 3 years. Refill needed for Buspar. Pollyann KennedyKim Becton, RN, BSN

## 2015-11-12 MED FILL — cloNIDine HCL 0.1 MG TABS: 0.1 | 30 days supply | Qty: 30 | Fill #1

## 2015-12-02 MED FILL — ?TRAZODONE 100 MG TABLET: 100 | 30 days supply | Qty: 30 | Fill #1

## 2015-12-08 ENCOUNTER — Ambulatory Visit: Payer: Self-pay | Attending: Family Medicine | Admitting: Family Medicine

## 2015-12-08 ENCOUNTER — Encounter: Payer: Self-pay | Admitting: Licensed Clinical Social Worker

## 2015-12-08 ENCOUNTER — Encounter: Payer: Self-pay | Admitting: Family Medicine

## 2015-12-08 VITALS — BP 105/62 | HR 71 | Temp 98.5°F | Ht 59.0 in | Wt 129.4 lb

## 2015-12-08 DIAGNOSIS — G4709 Other insomnia: Secondary | ICD-10-CM

## 2015-12-08 DIAGNOSIS — F329 Major depressive disorder, single episode, unspecified: Secondary | ICD-10-CM | POA: Insufficient documentation

## 2015-12-08 DIAGNOSIS — F418 Other specified anxiety disorders: Secondary | ICD-10-CM

## 2015-12-08 DIAGNOSIS — F419 Anxiety disorder, unspecified: Secondary | ICD-10-CM | POA: Insufficient documentation

## 2015-12-08 DIAGNOSIS — Z79899 Other long term (current) drug therapy: Secondary | ICD-10-CM | POA: Insufficient documentation

## 2015-12-08 DIAGNOSIS — F515 Nightmare disorder: Secondary | ICD-10-CM

## 2015-12-08 MED ORDER — CLONIDINE HCL 0.1 MG PO TABS: 0.1000 mg | ORAL_TABLET | Freq: Every day | ORAL | 3 refills | Status: DC

## 2015-12-08 MED ORDER — BUSPIRONE HCL 10 MG PO TABS: 10.0000 mg | ORAL_TABLET | Freq: Two times a day (BID) | ORAL | 3 refills | Status: DC

## 2015-12-08 MED FILL — busPIRone HCL 10 MG TABS: 10 | 30 days supply | Qty: 60 | Fill #0

## 2015-12-08 MED FILL — cloNIDine HCL 0.1 MG TABS: 0.1 | 30 days supply | Qty: 30 | Fill #0

## 2015-12-08 NOTE — Progress Notes (Signed)
Subjective:  Patient ID: Amy PittsMaricela Tyler, female    DOB: 17-Sep-1980  Age: 35 y.o. MRN: [id number]  CC: Insomnia and Anxiety   HPI Amy Tyler s a 35 year old female with a history of anxiety, insomnia whom I had seen for anxiety and insomnia and had resumed BuSpar for anxiety prior to which she has been taking Klonopin and Ambien which I had discontinued.  She complains of BuSpar making her sleepy and so she has to take it at nighttime only; complains that she still has insomnia and sleeps up to 2 AM. Initially informed me she took a sleeping medicine every 3-4 days because "she did not want to get used to it "but then went on to inform me later she took it every day. She is unable to sleep because she wakes up and has racing thoughts in her head.  Review of her medications indicate she still has a few pills of Ambien from a previous clinician.   Past Medical History:  Diagnosis Date  . Anxiety   . Depression     History reviewed. No pertinent surgical history.  No Known Allergies   Outpatient Medications Prior to Visit  Medication Sig Dispense Refill  . traZODone (DESYREL) 100 MG tablet Take 1 tablet (100 mg total) by mouth at bedtime. 30 tablet 3  . busPIRone (BUSPAR) 10 MG tablet Take 1 tablet (10 mg total) by mouth 2 (two) times daily. 60 tablet 3  . cloNIDine (CATAPRES) 0.1 MG tablet Take 1 tablet (0.1 mg total) by mouth at bedtime. 30 tablet 3   No facility-administered medications prior to visit.     ROS Review of Systems  Constitutional: Negative for activity change, appetite change and fatigue.  HENT: Negative for congestion, sinus pressure and sore throat.   Eyes: Negative for visual disturbance.  Respiratory: Negative for cough, chest tightness, shortness of breath and wheezing.   Cardiovascular: Negative for chest pain and palpitations.  Gastrointestinal: Negative for abdominal distention, abdominal pain and constipation.  Endocrine: Negative for  polydipsia.  Genitourinary: Negative for dysuria and frequency.  Musculoskeletal: Positive for neck pain. Negative for arthralgias and back pain.  Skin: Negative for rash.  Neurological: Negative for tremors, light-headedness and numbness.  Hematological: Does not bruise/bleed easily.  Psychiatric/Behavioral: Positive for sleep disturbance. Negative for agitation and behavioral problems. The patient is nervous/anxious.     Objective:  BP 105/62 (BP Location: Right Arm, Patient Position: Sitting, Cuff Size: Large)   Pulse 71   Temp 98.5 F (36.9 C) (Oral)   Ht 4\' 11"  (1.499 m)   Wt 129 lb 6.4 oz (58.7 kg)   SpO2 99%   BMI 26.14 kg/m   BP/Weight 12/08/2015 10/27/2015 10/06/2015  Systolic BP 105 104 98  Diastolic BP 62 60 63  Wt. (Lbs) 129.4 129 128.2  BMI 26.14 26.05 25.89      Physical Exam  Constitutional: She is oriented to person, place, and time. She appears well-developed and well-nourished.  Cardiovascular: Normal rate, normal heart sounds and intact distal pulses.   No murmur heard. Pulmonary/Chest: Effort normal and breath sounds normal. She has no wheezes. She has no rales. She exhibits no tenderness.  Abdominal: Soft. Bowel sounds are normal. She exhibits no distension and no mass. There is no tenderness.  Musculoskeletal: Normal range of motion.  Neurological: She is alert and oriented to person, place, and time.     Assessment & Plan:   1. Nightmares - cloNIDine (CATAPRES) 0.1 MG tablet; Take 1 tablet (0.1  mg total) by mouth at bedtime.  Dispense: 30 tablet; Refill: 3  2. Anxiety and depression Cannot exclude underlying mood disorder given racing thoughts. LCSW called in to speak with the patient regarding referral to psychiatry services at [facility] - busPIRone (BUSPAR) 10 MG tablet; Take 1 tablet (10 mg total) by mouth 2 (two) times daily.  Dispense: 60 tablet; Refill: 3  3. Other insomnia Uncontrolled Continue clonidine and trazodone until appointment with  psych.   Meds ordered this encounter  Medications  . cloNIDine (CATAPRES) 0.1 MG tablet    Sig: Take 1 tablet (0.1 mg total) by mouth at bedtime.    Dispense:  30 tablet    Refill:  3  . busPIRone (BUSPAR) 10 MG tablet    Sig: Take 1 tablet (10 mg total) by mouth 2 (two) times daily.    Dispense:  60 tablet    Refill:  3    This note has been created with Education officer, environmental. Any transcriptional errors are unintentional.    Follow-up: Return in about 3 months (around 03/09/2016) for follow up of anxiety.   Jaclyn Shaggy MD

## 2015-12-08 NOTE — Progress Notes (Signed)
Medication refills

## 2015-12-08 NOTE — Progress Notes (Signed)
Session Start time: 10:45 am   End Time: 11:15 am Total Time:  30 minutes Type of Service: Behavioral Health - Individual/Family Interpreter: Yes.     Interpreter Name & LanguageDavonna Belling: Margarita [phone] # [facility]   SUBJECTIVE: [name] is a 35 y.o. Female who [facility]. Pt. was referred by Dr. Venetia NightAmao for:  anxiety and insomia. Pt. reports the following symptoms/concerns: uncontrollable worry, racing thoughts, crying episodes, insomnia Duration of problem:  Onset of depression and anxiety occurred three years ago. Pt reports no symptoms of depression in a month; however, anxiety symptoms have progressed in that amount of time Severity: Moderate Previous treatment: Pt takes prescribed medications to address mental health    OBJECTIVE: Mood: Anxious & Affect: Tearful Risk of harm to self or others: Not indicated Assessments administered: PHQ-9, GAD-7  LIFE CONTEXT:  Family & Social: Pt has a spouse and three daughters. 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 She disclosed that drinking herbal teas helps her relax.  Life changes: Pt's oldest daughter has been diagnosed with Autism. Pt disclosed daughter receives therapy at school and participates [facility]. Her two youngest daughters have medical diagnoses regarding their thyroids. Pt disclosed difficulty coping with her children's health. What is important to pt/family (values): Family   GOALS ADDRESSED:  Decrease symptoms of anxiety  INTERVENTIONS: Motivational Interviewing and Supportive   ASSESSMENT:  Pt currently experiencing anxiety triggered by providing care her three children with special needs. Pt reported symptoms of uncontrollable worry, racing thoughts, crying episodes, and insomnia.  Pt  may benefit from psychoeducation, applying healthy coping skills to decrease anxiety, and establishing psychiatry services for medication management.       PLAN: 1. F/U with behavioral health clinician: LCSWA will follow up with pt in two weeks. Pt was encouraged to schedule an appointment with LCSWA if symptoms worsen or fail to improve. 2. Behavioral Health meds: Buspirone 3. Behavioral recommendations: LCSWA recommends pt apply healthy coping skills discussed during visit (i.e. Deep breathing) to address anxiety. LCSWA provided pt with handouts on depression and anxiety, as well, as community resources. [facility]. Pt agreed to follow up with referral and did not verbalize any barriers to treatment. 4. Referral: Brief Counseling/Psychotherapy, Publishing rights managerCommunity Resource and Psychoeducation 5. From scale of 1-10, how likely are you to follow plan: 8/10   Warmhandoff:   Warm Hand Off Completed.      Jenel LucksJasmine Kenleigh Toback, MSW, LCSWA 12:20 PM

## 2015-12-23 ENCOUNTER — Ambulatory Visit: Payer: Self-pay | Attending: Family Medicine

## 2016-02-02 MED FILL — cloNIDine HCL 0.1 MG TABS: 0.1 | 30 days supply | Qty: 30 | Fill #1

## 2016-02-28 NOTE — L&D Delivery Note (Signed)
Patient is 36 y.o. [id number] [redacted]w[redacted]d admitted for active labor with rapid progression to complete. AROM just before delivery.  Prenatal course also complicated by anxiety and depression, unmedicated.  Delivery Note At 5:46 AM a viable female was delivered via Vaginal, Spontaneous Delivery (Presentation: ROA, compound).  APGAR: 9, 9; weight pending.   Placenta status: intact with 3 vessels.  Cord: no complications. Cord pH: N/A.  Anesthesia:  None Episiotomy: None Lacerations: None Est. Blood Loss (mL): 100  Mom to postpartum.  Baby to Couplet care / Skin to Skin.  Upon arrival patient was complete and pushing. After AROM, she pushed once with good maternal effort to deliver a viable female infant in cephalic, ROA position with compound presentation. No nuchal cord present. Baby delivered without difficulty (anterior shoulder delivered with ease), was noted to have good tone and place on maternal abdomen for oral suctioning, drying and stimulation. Delayed cord clamping performed. Placenta delivered spontaneously with gentle cord traction. Fundus firm with massage and Pitocin. Perineum inspected and found to have no laceration. Counts of sharps, instruments, and lap pads were all correct.  Philipp Deputy, CNM, supervised delivery.   Burna Cash, MD Family Medicine Resident, PGY-3 11/16/2016 6:03 AM  Patient is a [id number] at [redacted]w[redacted]d who was admitted in SOL, essentially uncomplicated prenatal course.  She progressed without augmentation.  I was gloved and present for delivery in its entirety.  Second stage of labor progressed, baby delivered after 1 contractions.  No decels during second stage noted.  Complications: none  Lacerations: none  EBL: 100cc  Cam Hai, CNM 6:14 AM 11/16/2016

## 2016-04-24 ENCOUNTER — Ambulatory Visit (INDEPENDENT_AMBULATORY_CARE_PROVIDER_SITE_OTHER): Payer: Self-pay | Admitting: *Deleted

## 2016-04-24 ENCOUNTER — Encounter: Payer: Self-pay | Admitting: Family Medicine

## 2016-04-24 DIAGNOSIS — Z3201 Encounter for pregnancy test, result positive: Secondary | ICD-10-CM

## 2016-04-24 DIAGNOSIS — Z3490 Encounter for supervision of normal pregnancy, unspecified, unspecified trimester: Secondary | ICD-10-CM

## 2016-04-24 LAB — POCT PREGNANCY, URINE: Preg Test, Ur: POSITIVE — AB

## 2016-04-24 NOTE — Progress Notes (Signed)
Here for pregnancy test test which was positive.Used [employer] interpreter Byrd HesselbachMaria [phone]. LMP 02/16/16 with EDD 11/22/16 making her 9wk5d. Wants to to go adopt a mom program. States stopped Trazodone, catapres, buspar  in October . Instructed not to start any new meds without checking with provider first

## 2016-05-24 ENCOUNTER — Other Ambulatory Visit: Payer: Self-pay

## 2016-05-24 DIAGNOSIS — Z3481 Encounter for supervision of other normal pregnancy, first trimester: Secondary | ICD-10-CM

## 2016-05-25 LAB — OBSTETRIC PANEL, INCLUDING HIV
ANTIBODY SCREEN: NEGATIVE
BASOS ABS: 0 10*3/uL (ref 0.0–0.2)
BASOS: 0 %
EOS (ABSOLUTE): 0.2 10*3/uL (ref 0.0–0.4)
Eos: 2 %
HEMATOCRIT: 35.6 % (ref 34.0–46.6)
HEMOGLOBIN: 12.2 g/dL (ref 11.1–15.9)
HEP B S AG: NEGATIVE
HIV Screen 4th Generation wRfx: NONREACTIVE
IMMATURE GRANS (ABS): 0 10*3/uL (ref 0.0–0.1)
Immature Granulocytes: 0 %
Lymphocytes Absolute: 1.9 10*3/uL (ref 0.7–3.1)
Lymphs: 20 %
MCH: 30.3 pg (ref 26.6–33.0)
MCHC: 34.3 g/dL (ref 31.5–35.7)
MCV: 89 fL (ref 79–97)
MONOCYTES: 5 %
Monocytes Absolute: 0.5 10*3/uL (ref 0.1–0.9)
NEUTROS ABS: 6.5 10*3/uL (ref 1.4–7.0)
Neutrophils: 73 %
Platelets: 295 10*3/uL (ref 150–379)
RBC: 4.02 x10E6/uL (ref 3.77–5.28)
RDW: 13.7 % (ref 12.3–15.4)
RPR: NONREACTIVE
RUBELLA: 9.05 {index} (ref >0.99)
Rh Factor: POSITIVE
WBC: 9.1 10*3/uL (ref 3.4–10.8)

## 2016-05-25 LAB — SICKLE CELL SCREEN: SICKLE CELL SCREEN: NEGATIVE

## 2016-05-31 ENCOUNTER — Encounter: Payer: Self-pay | Admitting: Family Medicine

## 2016-05-31 ENCOUNTER — Ambulatory Visit (INDEPENDENT_AMBULATORY_CARE_PROVIDER_SITE_OTHER): Payer: Self-pay | Admitting: Family Medicine

## 2016-05-31 VITALS — BP 108/62 | HR 93 | Temp 97.9°F | Wt 133.0 lb

## 2016-05-31 DIAGNOSIS — Z3480 Encounter for supervision of other normal pregnancy, unspecified trimester: Secondary | ICD-10-CM

## 2016-05-31 LAB — POCT UA - MICROSCOPIC ONLY

## 2016-05-31 LAB — POCT URINALYSIS DIP (MANUAL ENTRY)
BILIRUBIN UA: NEGATIVE
Blood, UA: NEGATIVE
Glucose, UA: NEGATIVE
Nitrite, UA: NEGATIVE
PROTEIN UA: NEGATIVE
Spec Grav, UA: 1.02 (ref 1.030–1.035)
Urobilinogen, UA: 0.2 (ref ?–2.0)
pH, UA: 7.5 (ref 5.0–8.0)

## 2016-05-31 NOTE — Progress Notes (Signed)
Amy Tyler is a 36 y.o. yo G4P3003 at [redacted]w[redacted]d who presents for her initial prenatal visit.  Pregnancy is not planned.  She reports fatigue and positive home pregnancy test on  Denies frequent urination, breast tenderness, morning sickness, nausea.  She  is taking PNV. See flow sheet for details.  She reports no concerns today.  This is her fourth baby -- has three daughters, aged 76, 65 and 59.  Unknown gender of current pregnancy.  She reports no complications with prior pregnancies or deliveries.  All were NSVD.  She notes at end of February she had been sneezing and felt like she had some right-sided belly pain.  This has happened more than once but occurs only with sneezing and lasts a few seconds.  Denies vaginal bleeding, cramping, discharge.   LMP: Dec 20th 2017.    Pregnancy test + on February 26th.    OB prenatal labs reviewed with patient:  Hepatitis B negative, RPR negative, Rubella - immune, Blood type O, Rh POS, HIV -negative.  Sickle cell screen - negative and labs otherwise unremarkable.   No history of diabetes and did not have GDM with prior pregnancies.  States she does want Quad Screen today because she did not get it with first pregnancy and her daughter had tuberous sclerosis.   PMH, POBH, FH, meds, allergies and Social Hx reviewed.  Prenatal Exam: Gen:  36 yo well nourished female in NAD. Vitals noted and wnl.  HEENT: NCAT, MMM, neck supple without cervical lymphadenopathy, thyromegaly or thyroid nodules.  Fair dentition. CV: RRR no MRG  Lungs: CTAB.  Normal respiratory effort without wheezes or rales. Abd: soft, gravid,  NT. +BS.  Uterus not appreciated above pelvis. GU: Normal external female genitalia without lesions.  Normal vaginal, well rugated without lesions. Some vaginal discharge.  Bimanual exam: No adnexal mass or TTP. No CMT.  Fundal height 15 cm.  Ext: No clubbing, cyanosis or edema. Psych: Normal grooming and dress.  Not depressed or anxious appearing.   Normal thought content and process without flight of ideas or looseness of associations.  Assessment & Plan: 1) 36 y.o. yo G4P3003 at 39 w 1 days via LMP doing well.  Good FHT heard with HR 153.  Current pregnancy issues include: None. Dating is reliable.   Prenatal labs reviewed: unremarkable. Genetic screening offered: Yes. Quad screen ordered today.  Early glucola is indicated.  Patient to schedule lab appointment for tomorrow.  [facility]  Referred to MFM for genetic counseling given first child with tuberous sclerosis.   GC Chlamydia cultures obtained at this visit.   PHQ-9 and Pregnancy Medical Home forms completed and reviewed.  No concerns noted on either form.  Depression noted on patient's chart.  She states she no longer has symptoms of anxiety and is not on antidepressants.  Stopped Lorazepam in October.   Bleeding and pain precautions reviewed.  Importance of prenatal vitamins reviewed.  Follow up in 4 weeks.   Freddrick March, MD [place], PGY-1 05/31/2016

## 2016-05-31 NOTE — Patient Instructions (Addendum)
It was so nice meeting you today! You were seen in clinic today for your initial OB visit.  Today we discussed your labs as well as past medical and obstetric history.  Your baby's heart rate was also checked and is great.  We obtained samples from urine and cervical swab to check for any infection.  In addition, we have referred you to maternal fetal medicine due to your history of prior baby with tuberous sclerosis for genetic counseling.  You should expect to hear back from them within a week to schedule an appointment.    You will also be scheduled for an anatomy ultrasound around 19 weeks.  I will go ahead and schedule an appointment for you and will call you with the date.     Please follow up in 4 weeks.    Be well,  Freddrick March, MD

## 2016-06-01 ENCOUNTER — Other Ambulatory Visit (INDEPENDENT_AMBULATORY_CARE_PROVIDER_SITE_OTHER): Payer: Self-pay

## 2016-06-01 DIAGNOSIS — Z3482 Encounter for supervision of other normal pregnancy, second trimester: Secondary | ICD-10-CM

## 2016-06-01 LAB — GC/CHLAMYDIA PROBE AMP (~~LOC~~) NOT AT ARMC
Chlamydia: NEGATIVE
Neisseria Gonorrhea: NEGATIVE

## 2016-06-01 LAB — POCT 1 HR PRENATAL GLUCOSE: Glucose 1 Hr Prenatal, POC: 101 mg/dL

## 2016-06-02 LAB — URINE CULTURE, OB REFLEX

## 2016-06-02 LAB — CULTURE, OB URINE

## 2016-06-06 LAB — AFP, QUAD SCREEN
DIA MOM VALUE: 0.79
DIA Value (EIA): 155.51 pg/mL
DSR (By Age)    1 IN: 292
DSR (SECOND TRIMESTER) 1 IN: 3418
GESTATIONAL AGE AFP: 15.1 wk
MATERNAL AGE AT EDD: 35.1 a
MSAFP Mom: 0.84
MSAFP: 25.7 ng/mL
MSHCG Mom: 0.86
MSHCG: 47378 m[IU]/mL
OSB RISK: 10000
Test Results:: NEGATIVE
UE3 MOM: 1.18
WEIGHT: 133 [lb_av]
uE3 Value: 0.7 ng/mL

## 2016-06-22 ENCOUNTER — Other Ambulatory Visit: Payer: Self-pay | Admitting: Family Medicine

## 2016-06-22 DIAGNOSIS — Z3490 Encounter for supervision of normal pregnancy, unspecified, unspecified trimester: Secondary | ICD-10-CM

## 2016-07-03 ENCOUNTER — Ambulatory Visit (INDEPENDENT_AMBULATORY_CARE_PROVIDER_SITE_OTHER): Payer: Self-pay | Admitting: Family Medicine

## 2016-07-03 VITALS — BP 122/58 | HR 100 | Temp 98.2°F | Wt 138.0 lb

## 2016-07-03 DIAGNOSIS — Z3480 Encounter for supervision of other normal pregnancy, unspecified trimester: Secondary | ICD-10-CM

## 2016-07-03 NOTE — Progress Notes (Signed)
Lawernce PittsMaricela Roa-Razo is a 36 y.o. G4P3003 at 6477w5d here for routine follow up.  She denies abdominal pain, vaginal bleeding and discharge.  She feels baby moving. Notes that she feels more comfortable on her left side.  Hurts to lay on her right side.   Denies breast tenderness, nausea, vomiting, fevers.  Is taking a PNV.  See flow sheet for details.  A/P: Pregnancy at 5177w5d.  Doing well.  Recommended laying on left side as this may be more comfortable for her.  Pregnancy issues include: history of previous child with tuberous sclerosis, MFM referral has been placed and patient to be scheduled with genetics counselor later this month.  Negative Quad screen.  Normal 1 hour glucose tolerance test.  GC Chlamydia from 4/4 negative.  Anatomy scan from 4/26 reviewed, problems are not noted.  Follow-up is indicated however to reevaluate placental position, Marginal vs partial placenta previa detected at this time.   Preterm labor precautions reviewed.  Follow up 4 weeks.

## 2016-07-03 NOTE — Patient Instructions (Signed)
It was nice seeing you in clinic today! You were seen for an OB visit and everything looks great so far.  Your anatomy scan for your baby was normal.  Your other testing for chromosomal abnormalities was also normal.    Please keep your appointment with [facility] genetics counselor and you can make an appointment to follow up with me in 4 weeks .    Be well,  Amy MarchYashika Danaysha Kirn, MD

## 2016-07-07 ENCOUNTER — Encounter: Payer: Self-pay | Admitting: Family Medicine

## 2016-07-11 ENCOUNTER — Encounter (HOSPITAL_COMMUNITY): Payer: Self-pay

## 2016-07-11 ENCOUNTER — Ambulatory Visit (HOSPITAL_COMMUNITY)
Admission: RE | Admit: 2016-07-11 | Discharge: 2016-07-11 | Disposition: A | Discharge status: Home / Self Care | Payer: Self-pay | Source: Ambulatory Visit | Attending: Family Medicine | Admitting: Family Medicine

## 2016-07-11 ENCOUNTER — Other Ambulatory Visit: Payer: Self-pay | Admitting: Family Medicine

## 2016-07-11 DIAGNOSIS — O09892 Supervision of other high risk pregnancies, second trimester: Secondary | ICD-10-CM | POA: Insufficient documentation

## 2016-07-11 DIAGNOSIS — O4402 Placenta previa specified as without hemorrhage, second trimester: Secondary | ICD-10-CM

## 2016-07-11 DIAGNOSIS — Z363 Encounter for antenatal screening for malformations: Secondary | ICD-10-CM

## 2016-07-11 DIAGNOSIS — O09522 Supervision of elderly multigravida, second trimester: Secondary | ICD-10-CM

## 2016-07-11 DIAGNOSIS — Z3A20 20 weeks gestation of pregnancy: Secondary | ICD-10-CM

## 2016-07-11 DIAGNOSIS — Z8489 Family history of other specified conditions: Secondary | ICD-10-CM

## 2016-07-11 DIAGNOSIS — O352XX0 Maternal care for (suspected) hereditary disease in fetus, not applicable or unspecified: Secondary | ICD-10-CM

## 2016-07-11 DIAGNOSIS — Z3490 Encounter for supervision of normal pregnancy, unspecified, unspecified trimester: Secondary | ICD-10-CM

## 2016-07-13 DIAGNOSIS — O352XX0 Maternal care for (suspected) hereditary disease in fetus, not applicable or unspecified: Secondary | ICD-10-CM | POA: Insufficient documentation

## 2016-07-13 NOTE — Progress Notes (Signed)
Genetic Counseling  High-Risk Gestation Note  Appointment Date:  07/11/2016 Referred By: Amy Feil, MD Date of Birth:  [birth year] Partner:  Amy Tyler   Pregnancy History: [id number] Estimated Date of Delivery: 11/22/16 Estimated Gestational Age: 12w6dAttending: MRenella Cunas MD   Ms. Amy Slutskywas seen for genetic counseling because of a previous child with Tuberous Sclerosis Complex and a maternal age of 36years old at delivery. [facility] English/Spanish interpreter, MGypsy Balsam provided interpretation for today's visit.      In summary:  Discussed AMA and associated risk for fetal aneuploidy  Reviewed results of previous Quad screening- within normal limits  Reduced risks for fetal Down syndrome and trisomy 111 Reviewed family history concerns  Couple's first child with Tuberous Sclerosis Complex  Patient stated that genetic testing performed for daughter and subsequent molecular testing for patient and partner within normal limits  Suspected de novo given reported family history  Recurrence risk for current pregnancy likely low (~1-2%) in the case of apparent de novo occurrence in previous daughter  Discussed options for screening  NIPS for aneuploidy -declined  NIPS for single gene conditions (including TSC)- declined  Ultrasound- performed today, within normal limits  Discussed diagnostic testing options  Amniocentesis- declined  Discussed carrier screening options- declined  CF  SMA  Hemoglobinopathies  Both family histories were reviewed and found to be contributory for Tuberous Sclerosis Complex (TSC) for the couple's daughter, their oldest child, Amy Tyler The patient reported that her daughter was diagnosed neonatally given characteristic heart and skin findings. 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 [name] that her daughter was evaluated by medical geneticist, Dr. PJaneal Holmesin [facility] and reported that genetic testing was performed for her daughter and was positive for a change in a gene on chromosome 16. The patient reported that subsequently, she and her husband were then both tested through blood work, which was normal. We did not have medical documentation to confirm this report at the time of the patient's visit. Amy Tyler currently 36years old and is the oldest of three daughters for the couple. [name], ages 943and 816years, do not appear to have features of TSC.   We discussed that TSC involves abnormalities of the skin (including hypomelanotic macules, facial angiofibromas, shagreen patches, fibrous facial plaques, and ungual fibromas); brain (cortical tubers, subependymal nodules and subependymal giant cell astrocytomas, seizures, intellectual disability, and developmental delay); kidney (angiomyolipomas, cysts, renal cell carcinoma); heart (rhabdomyoma, arrhythmia); and lungs. The skin is affected in virtually 100% of individuals with TSC.  Subependymal nodules occur in 90% of individuals and cortical or subcortical tubers in 70%.  Subependymal giant cell astrocytomas occur in up to 14% of all individuals with TSC.  More than 80% of individuals with TSC have seizures, and at least 50% have developmental delay or intellectual disabilities.  Approximately 80% of individuals with TSC have renal lesions, and 47-67% have cardiac rhabdomyomas.  Up to 75% of affected individuals have retinal lesions.   There are two genes which have been implicated in the majority of cases of TSC.  TSC1, located on chromosome 9 codes for the protein hamartin and TSC 2, located on chromosome 16 codes for the  protein tuberin.  Both are inherited in an autosomal dominant  manner and have features consistent with tumor suppressor genes, and function according to Knudson's "two-hit" hypothesis.  The clinical variability results from the random nature of the second "hit" in persons with a germline mutation in either TSC1 or TSC2.  The phenotypes exhibited with a TSC2 mutation are thought to be more severe than with a TSC1 mutation.  It is thought that the penetrance of a TSC1 or TSC2 mutation is 100%, but that variable expressivity occurs.  We discussed that approximately two-thirds (~66%) of affected individuals have TSC as result of a de novo pathogenic variant (not inherited from either parent). In the case that a parent has TSC, each offspring would have a 1 in 2 (50%) chance to inherit the condition.   Based on the patient's reported family history that her daughter had an identified causative gene change (most likely in TSC2 based on the patient's reported information), and the patient and her husband tested negative for this as well as given the reported that Amy Tyler and her husband reportedly do not have features of TSC, then TSC in their daughter would most likely be due to a de novo pathogenic gene change. In this case, recurrence risk would likely be low, estimated to be 1-2%, but cannot be ruled out given that gonadal mosaicism cannot be ruled out. In the case that either Amy Tyler or her husband have the same underlying gene change as their daughter, then recurrence risk would be 50% for each offspring.   She was counseled that while we could test prenatally to determine if a child inherited this gene mutation, we could not predict the expression of the condition in that child.  Documentation of the genetic testing for Amy Tyler's daughter would first need to be obtained prior to pursuing prenatal molecular testing or screening for TSC in order for this testing to be most informative. If Amy Tyler daughter does not have an identified pathogenic change in  either TSC1 or TSC2, then prenatal screening or testing for TSC would not be informative in this case. We discussed the option of amniocentesis and genetic testing for the particular mutation identified in their family.  We discussed the approximate 1 in 300-500 chance for pregnancy complication related to amniocentesis. She understands that ultrasound cannot rule out all birth defects or genetic syndromes, including TSC. The patient was advised of this limitation and states that she is not interested in having prenatal diagnostic testing for TSC.   We discussed an additional prenatal screening option for TSC; there is a newer noninvasive prenatal screening (NIPS)/cell free DNA platform ([facility] through [facility]) that is able to assess for mutations in a panel of 30 selected genes. The conditions selected for this panel are autosomal dominant or X-linked conditions that typically occur due to de novo gene variations. TSC1 and TSC2 genes are included on this panel, and the detection rate for these genes on this screen is reported to be 82-96%. We reviewed the methodology behind this screen and that the reported detection rate ranges from 43% to greater than 96%, depending upon the specific condition and specific gene. We reviewed possible results including positive, negative, unexpected findings, and no result. We reviewed limitations of the test including that it is not diagnostic, it relies on a high enough fetal fraction in the sample, and that if the patient carriers a mutation in one of the genes on the panel, analysis cannot be performed for the  pregnancy. We also discussed that if a mutation is identified in the paternal sample, this will also be reported to the couple. We reviewed the billing process and possible cost of the test. We reviewed that if Ms. Demetrios Tyler is interested in this screen, obtaining documentation of her daughter's genetic testing should first be obtained, as this would indicate whether or  not screening for gene alterations in TSC1 and TSC2 would be informative regarding TSC in the family. Ms. Demetrios Tyler stated that she is not interested in NIPS for single gene conditions ([facility]) to screen for TSC in the current pregnancy. Without further information regarding the provided family history, an accurate genetic risk cannot be calculated. Further genetic counseling is warranted if more information is obtained.  She was also counseled regarding maternal age and the association with risk for chromosome conditions due to nondisjunction with aging of the ova.   We reviewed chromosomes, nondisjunction, and the associated 1 in 141 risk for fetal aneuploidy at 49w6dgestation related to a maternal age of 36years old at delivery.  She was counseled that the risk for aneuploidy decreases as gestational age increases, accounting for those pregnancies which spontaneously abort.  We specifically discussed Down syndrome (trisomy 256, trisomies 172and 154 and sex chromosome aneuploidies (47,XXX and 47,XXY) including the common features and prognoses of each.   Ms. RDemetrios Isaacspreviously had Quad screen, which was within normal limits for the conditions screened. We discussed that screening tests are used to modify a patient's a priori risk for aneuploidy, typically based on age. This estimate provides a pregnancy specific risk assessment. Quad screening reduced the risks for fetal Down syndrome (1 in 292 to 1 in 3418), trisomy 147 and open spina bifida. We reviewed the sensitivity of this screen. She understands it is not diagnostic and does not assess for all chromosome conditions.   We reviewed additional available screening options including noninvasive prenatal screening (NIPS)/cell free DNA (cfDNA) screening for fetal aneuploidy and detailed ultrasound. We reviewed the benefits and limitations of each option. Specifically, we discussed the conditions for which each test screens, the detection rates, and false  positive rates of each. She was also counseled regarding diagnostic testing via amniocentesis. We reviewed the approximate 1 in 3283-662risk for complications from amniocentesis, including spontaneous pregnancy loss. We discussed the possible results that the tests might provide including: positive, negative, unanticipated, and no result. Finally, they were counseled regarding the cost of each option and potential out of pocket expenses. After consideration of all the options, she declined NIPS and amniocentesis for fetal aneuploidy.   A detailed ultrasound was performed today. The ultrasound report will be sent under separate cover. There were no visualized fetal anomalies or markers suggestive of aneuploidy. She understands that screening tests cannot rule out all birth defects or genetic syndromes. The patient was advised of this limitation and states she still does not want additional testing at this time.   Ms. MLateshia Schmokerwas provided with written information regarding cystic fibrosis (CF), spinal muscular atrophy (SMA) and hemoglobinopathies including the carrier frequency, availability of carrier screening and prenatal diagnosis if indicated.  In addition, we discussed that CF and hemoglobinopathies are routinely screened for as part of the [facility] newborn screening panel.  She declined screening for CF, SMA and hemoglobinopathies.  Ms. MAlaja Goldingerdenied exposure to environmental toxins or chemical agents. She denied the use of alcohol, tobacco or street drugs. She denied significant viral illnesses during the course of her pregnancy. Her medical  and surgical histories were noncontributory.   I counseled Amy Tyler regarding the above risks and available options.  The approximate face-to-face time with the genetic counselor was 45 minutes.  Chipper Oman, MS,  Certified Genetic Counselor 07/13/2016

## 2016-07-14 DIAGNOSIS — Z3493 Encounter for supervision of normal pregnancy, unspecified, third trimester: Secondary | ICD-10-CM | POA: Insufficient documentation

## 2016-08-02 ENCOUNTER — Ambulatory Visit (INDEPENDENT_AMBULATORY_CARE_PROVIDER_SITE_OTHER): Payer: Self-pay | Admitting: Student

## 2016-08-02 VITALS — BP 100/60 | HR 88 | Temp 98.3°F | Wt 145.0 lb

## 2016-08-02 DIAGNOSIS — Z3482 Encounter for supervision of other normal pregnancy, second trimester: Secondary | ICD-10-CM

## 2016-08-02 NOTE — Patient Instructions (Signed)
Follow up in 4 weeks If you feel you have strong , regular contractions, loss of fluid like you broke your water, vaginal bleeding like or greater than a period [facility]. If you feel your baby is moving less, sit in a quiet place for 2 hours and count baby's movements. You want at least 10 movements in 2 hours. If [facility] less than this [facility].  Call the office at [phone] with questions or concerns

## 2016-08-02 NOTE — Progress Notes (Signed)
36 y.o. [id number] at 24/0 ( LMP): Pregnancy complicated by placenta previa now resolved on US on 5/15 S: NO contractions, Lossf of fluid, vaginal bleeding, normal fetal movement O: See tab  A/P IUP at 24/0 Doing well Routine OB: follow up for third trimester labs at next visit, tdap at that time. Negative Quad screen. Had genetics counselor visit already Post partum: boy, desires nexplanon  Nary Sneed A. Kennon RoundsHaney MD, MS Family Medicine Resident PGY-3 Pager [phone]

## 2016-08-31 ENCOUNTER — Ambulatory Visit (INDEPENDENT_AMBULATORY_CARE_PROVIDER_SITE_OTHER): Payer: Self-pay | Admitting: Internal Medicine

## 2016-08-31 VITALS — BP 104/60 | HR 88 | Temp 97.7°F | Wt 150.6 lb

## 2016-08-31 DIAGNOSIS — R6889 Other general symptoms and signs: Secondary | ICD-10-CM

## 2016-08-31 DIAGNOSIS — Z3402 Encounter for supervision of normal first pregnancy, second trimester: Secondary | ICD-10-CM

## 2016-08-31 DIAGNOSIS — Z3A20 20 weeks gestation of pregnancy: Secondary | ICD-10-CM

## 2016-08-31 DIAGNOSIS — F518 Other sleep disorders not due to a substance or known physiological condition: Secondary | ICD-10-CM

## 2016-08-31 LAB — POCT 1 HR PRENATAL GLUCOSE: Glucose 1 Hr Prenatal, POC: 117 mg/dL

## 2016-08-31 NOTE — Assessment & Plan Note (Addendum)
Has always had vivid dreams, recently more vivid than normal. Does not feel she able to get good rest. The patient at previous office and was supposed to be seen by psychiatry for vivid dreams and depression. She currently denies any depression or SI. Patient to 9 is a 5 -was not scored for sleep issues as this was due to the vivid dreams. Would like to see psychiatry  - [facility] preference  - Counseled patient to decrease TV to stop any stimulating affects.

## 2016-08-31 NOTE — Patient Instructions (Addendum)
Please go to  [facility] in [place], [place]  Address: [address] [place], [place], [place] [address]  Phone: [phone]   Tercer trimestre Derry Lory (Third Trimester of Pregnancy) El tercer trimestre comprende desde la semana29 hasta la semana42, es decir, desde el mes7 hasta el [address] En este trimestre, el feto crece muy rpido. Hacia el final del noveno mes, el feto mide alrededor de 20pulgadas (45cm) de largo y pesa entre 6y 10libras [phone]). CUIDADOS EN EL HOGAR  No fume, no consuma hierbas ni beba alcohol. No tome frmacos que el mdico no haya autorizado.  No consuma ningn producto que contenga tabaco, lo que incluye cigarrillos, tabaco de Theatre manager o Administrator, Civil Service. Si necesita ayuda para dejar de fumar, consulte al [employer]. Puede recibir asesoramiento u otro tipo de apoyo para dejar de fumar.  Tome los medicamentos solamente como se lo haya indicado el mdico. Algunos medicamentos son seguros para tomar durante el Psychiatrist y otros no lo son.  Haga ejercicios solamente como se lo haya indicado el mdico. Interrumpa la actividad fsica si comienza a tener calambres.  Ingiera alimentos saludables de [place] regular.  Use un sostn que le brinde buen soporte si sus mamas estn sensibles.  No se d baos de inmersin en agua caliente, baos turcos ni saunas.  Colquese el cinturn de seguridad cuando conduzca.  No coma carne cruda ni queso sin cocinar; evite el contacto con las bandejas sanitarias de los gatos y la tierra que estos animales usan.  Tome las vitaminas prenatales.  Tome entre 1500 y 2000mg  de calcio diariamente comenzando en la semana20 del embarazo [place].  Pruebe tomar un medicamento que la ayude a defecar (un laxante suave) si el mdico lo autoriza. Consuma ms fibra, que se encuentra en las frutas y verduras frescas y los cereales integrales. Beba suficiente lquido para mantener el pis (orina) claro  o de color amarillo plido.  Dese baos de asiento con agua tibia para Engineer, materials o las molestias causadas por las hemorroides. Use una crema para las hemorroides si el mdico la autoriza.  Si se le hinchan las venas (venas varicosas), use medias de descanso. Levante (eleve) los pies durante , 3 o 4veces por Futures trader. Limite el consumo de sal en su dieta.  No levante objetos pesados, use zapatos de tacones bajos y sintese derecha.  Descanse con las piernas elevadas si tiene calambres o dolor de cintura.  Visite a su dentista si no lo ha Occupational hygienist. Use un cepillo de cerdas suaves para cepillarse los dientes. Psese el hilo dental con suavidad.  Puede seguir [employer], a menos que el mdico le indique lo contrario.  No haga viajes de larga distancia, excepto si es obligatorio y solamente con la aprobacin del mdico.  Tome clases prenatales.  Practique ir manejando al hospital.  Prepare el bolso que llevar al hospital.  Prepare la habitacin del beb.  Concurra a los controles mdicos.  SOLICITE AYUDA SI:  No est segura de si est en trabajo de parto o si ha roto la bolsa de las aguas.  Tiene mareos.  Siente calambres leves o presin en la parte inferior del abdomen.  Sufre un dolor persistente en el abdomen.  Tiene Programme researcher, broadcasting/film/video (nuseas), vmitos, o tiene deposiciones acuosas (diarrea).  Advierte un olor ftido que proviene de la vagina.  Siente dolor al [employer].  SOLICITE AYUDA DE INMEDIATO SI:  Tiene fiebre.  Tiene una prdida de  lquido por la vagina.  Tiene sangrado o pequeas prdidas vaginales.  Siente dolor intenso o clicos en el abdomen.  Sube o baja de peso rpidamente.  Tiene dificultades para recuperar el aliento y siente dolor en el pecho.  Sbitamente se le hinchan mucho el rostro, las [place], los tobillos, los pies o las piernas.  No ha sentido los movimientos del beb durante Georgianne Fickuna  hora.  Siente un dolor de cabeza intenso que no se alivia con medicamentos.  Su visin se modifica.  Esta informacin no tiene Theme park managercomo fin reemplazar el consejo del mdico. Asegrese de hacerle al mdico cualquier pregunta que tenga. Document Released: 10/16/2012 Document Revised: 03/06/2014 Document Reviewed: 04/16/2012 Elsevier Interactive Patient Education  2017 Elsevier Inc.  Evaluacin de los movimientos fetales  (Fetal Movement Counts) Nombre del paciente: __________________________________________________ Micheline ChapmanFecha de parto estimada: ____________________ Caroleen HammanLa evaluacin de los movimientos fetales es muy recomendable en los embarazos de alto riesgo, pero tambin es una buena idea que lo hagan todas las Bertholdembarazadas. El Firefightermdico le indicar que comience a contarlos a las 28 semanas de [place]embarazo. Los movimientos fetales suelen aumentar:   Despus de Animatoruna comida completa.  Despus de la actividad fsica.  Despus de comer o beber Graybar Electricalgo dulce o fro.  En reposo. Preste atencin cuando sienta que el beb est ms activo. Esto le ayudar a notar un patrn de ciclos de vigilia y sueo de su beb y cules son los factores que contribuyen a un aumento de los movimientos fetales. Es importante llevar a cabo un recuento de movimientos fetales, al mismo tiempo cada da, cuando el beb normalmente est ms activo.  CMO CONTAR LOS MOVIMIENTOS FETALES 1. Busque un lugar tranquilo y cmodo para sentarse o recostarse sobre el lado izquierdo. Al recostarse sobre su lado izquierdo, le proporciona una mejor circulacin de [place] y oxgeno al beb. 2. Anote el da y la hora en una hoja de papel o en un diario. 3. Comience contando las pataditas, revoloteos, chasquidos, vueltas o pinchazos en un perodo de 2 horas. Debe sentir al menos 10 movimientos en 2 horas. 4. Si no siente 10 movimientos en 2 horas, espere 2  3 horas y cuente de nuevo. Busque cambios en el patrn o si no cuenta lo suficiente en 2  horas. SOLICITE ATENCIN MDICA SI:   Siente menos de 10 pataditas en 2 horas, en dos intentos.  No hay movimientos durante una hora.  El patrn se modifica o le lleva ms tiempo Art gallery managercada da contar las 10 pataditas.  Siente que el beb no se mueve como lo hace habitualmente. Fecha: ____________ Movimientos: ____________ Stevan BornHora de inicio: ____________ Stevan BornHora de finalizacin: ____________  Franco NonesFecha: ____________ Movimientos: ____________ Stevan BornHora de inicio: ____________ Stevan BornHora de finalizacin: ____________  Franco NonesFecha: ____________ Movimientos: ____________ Stevan BornHora de inicio: ____________ Stevan BornHora de finalizacin: ____________  Franco NonesFecha: ____________ Movimientos: ____________ Stevan BornHora de inicio: ____________ Stevan BornHora de finalizacin: ____________  Franco NonesFecha: ____________ Movimientos: ____________ Stevan BornHora de inicio: ____________ Mammie RussianHora de finalizacin: ____________  Franco NonesFecha: ____________ Movimientos: ____________ Mammie RussianHora de inicio: ____________ Mammie RussianHora de finalizacin: ____________  Franco NonesFecha: ____________ Movimientos: ____________ Mammie RussianHora de inicio: ____________ Mammie RussianHora de finalizacin: ____________  Franco NonesFecha: ____________ Movimientos: ____________ Mammie RussianHora de inicio: ____________ Mammie RussianHora de finalizacin: ____________  Franco NonesFecha: ____________ Movimientos: ____________ Stevan BornHora de inicio: ____________ Mammie RussianHora de finalizacin: ____________  Franco NonesFecha: ____________ Movimientos: ____________ Stevan BornHora de inicio: ____________ Stevan BornHora de finalizacin: ____________  Franco NonesFecha: ____________ Movimientos: ____________ Stevan BornHora de inicio: ____________ Stevan BornHora de finalizacin: ____________  Franco NonesFecha: ____________ Movimientos: ____________ Stevan BornHora de inicio: ____________ Stevan BornHora de finalizacin: ____________  Franco NonesFecha: ____________  Movimientos: ____________ Stevan Born inicio: ____________ Stevan Born finalizacin: ____________  Franco Nones: ____________ Movimientos: ____________ Stevan Born inicio: ____________ Stevan Born finalizacin: ____________  Franco Nones: ____________ Movimientos: ____________ Stevan Born inicio: ____________ Stevan Born finalizacin:  ____________  Franco Nones: ____________ Movimientos: ____________ Stevan Born inicio: ____________ Stevan Born finalizacin: ____________  Franco Nones: ____________ Movimientos: ____________ Stevan Born inicio: ____________ Stevan Born finalizacin: ____________  Franco Nones: ____________ Movimientos: ____________ Mammie Russian de inicio: ____________ Mammie Russian de finalizacin: ____________  Franco Nones: ____________ Movimientos: ____________ Mammie Russian de inicio: ____________ Mammie Russian de finalizacin: ____________  Franco Nones: ____________ Movimientos: ____________ Mammie Russian de inicio: ____________ Mammie Russian de finalizacin: ____________  Franco Nones: ____________ Movimientos: ____________ Mammie Russian de inicio: ____________ Mammie Russian de finalizacin: ____________  Franco Nones: ____________ Movimientos: ____________ Mammie Russian de inicio: ____________ Mammie Russian de finalizacin: ____________  Franco Nones: ____________ Movimientos: ____________ Mammie Russian de inicio: ____________ Mammie Russian de finalizacin: ____________  Franco Nones: ____________ Movimientos: ____________ Mammie Russian de inicio: ____________ Mammie Russian de finalizacin: ____________  Franco Nones: ____________ Movimientos: ____________ Mammie Russian de inicio: ____________ Mammie Russian de finalizacin: ____________  Franco Nones: ____________ Movimientos: ____________ Mammie Russian de inicio: ____________ Mammie Russian de finalizacin: ____________  Franco Nones: ____________ Movimientos: ____________ Mammie Russian de inicio: ____________ Mammie Russian de finalizacin: ____________  Franco Nones: ____________ Movimientos: ____________ Mammie Russian de inicio: ____________ Mammie Russian de finalizacin: ____________  Franco Nones: ____________ Movimientos: ____________ Mammie Russian de inicio: ____________ Mammie Russian de finalizacin: ____________  Franco Nones: ____________ Movimientos: ____________ Mammie Russian de inicio: ____________ Mammie Russian de finalizacin: ____________  Franco Nones: ____________ Movimientos: ____________ Mammie Russian de inicio: ____________ Mammie Russian de finalizacin: ____________  Franco Nones: ____________ Movimientos: ____________ Mammie Russian de inicio: ____________ Mammie Russian de finalizacin: ____________  Franco Nones: ____________  Movimientos: ____________ Mammie Russian de inicio: ____________ Mammie Russian de finalizacin: ____________  Franco Nones: ____________ Movimientos: ____________ Mammie Russian de inicio: ____________ Mammie Russian de finalizacin: ____________  Franco Nones: ____________ Movimientos: ____________ Mammie Russian de inicio: ____________ Mammie Russian de finalizacin: ____________  Franco Nones: ____________ Movimientos: ____________ Mammie Russian de inicio: ____________ Mammie Russian de finalizacin: ____________  Franco Nones: ____________ Movimientos: ____________ Mammie Russian de inicio: ____________ Mammie Russian de finalizacin: ____________  Franco Nones: ____________ Movimientos: ____________ Mammie Russian de inicio: ____________ Mammie Russian de finalizacin: ____________  Franco Nones: ____________ Movimientos: ____________ Mammie Russian de inicio: ____________ Mammie Russian de finalizacin: ____________  Franco Nones: ____________ Movimientos: ____________ Mammie Russian de inicio: ____________ Mammie Russian de finalizacin: ____________  Franco Nones: ____________ Movimientos: ____________ Mammie Russian de inicio: ____________ Mammie Russian de finalizacin: ____________  Franco Nones: ____________ Movimientos: ____________ Mammie Russian de inicio: ____________ Mammie Russian de finalizacin: ____________  Franco Nones: ____________ Movimientos: ____________ Mammie Russian de inicio: ____________ Mammie Russian de finalizacin: ____________  Franco Nones: ____________ Movimientos: ____________ Mammie Russian de inicio: ____________ Mammie Russian de finalizacin: ____________  Franco Nones: ____________ Movimientos: ____________ Mammie Russian de inicio: ____________ Mammie Russian de finalizacin: ____________  Franco Nones: ____________ Movimientos: ____________ Mammie Russian de inicio: ____________ Mammie Russian de finalizacin: ____________  Franco Nones: ____________ Movimientos: ____________ Mammie Russian de inicio: ____________ Mammie Russian de finalizacin: ____________  Franco Nones: ____________ Movimientos: ____________ Mammie Russian de inicio: ____________ Mammie Russian de finalizacin: ____________  Franco Nones: ____________ Movimientos: ____________ Mammie Russian de inicio: ____________ Mammie Russian de finalizacin: ____________  Franco Nones: ____________ Movimientos: ____________ Mammie Russian de inicio:  ____________ Mammie Russian de finalizacin: ____________  Franco Nones: ____________ Movimientos: ____________ Mammie Russian de inicio: ____________ Mammie Russian de finalizacin: ____________  Franco Nones: ____________ Movimientos: ____________ Mammie Russian de inicio: ____________ Mammie Russian de finalizacin: ____________  Franco Nones: ____________ Movimientos: ____________ Mammie Russian de inicio: ____________ Mammie Russian de finalizacin: ____________  Franco Nones: ____________ Movimientos: ____________ Mammie Russian de inicio: ____________ Mammie Russian de finalizacin: ____________  Franco Nones: ____________ Movimientos: ____________ Mammie Russian de inicio: ____________ Mammie Russian de finalizacin: ____________  Franco Nones: ____________ Movimientos: ____________ Mammie Russian de inicio: ____________ Mammie Russian de finalizacin: ____________  Esta informacin no tiene como fin reemplazar el consejo del mdico. Asegrese de hacerle al mdico cualquier pregunta que tenga. Document Released: 05/23/2007 Document Revised: 01/31/2012 Elsevier  Education  2017 Elsevier Inc.  

## 2016-08-31 NOTE — Progress Notes (Signed)
Amy PittsMaricela Tyler is a 36 y.o. G4P3003 at 2020w1d for routine follow up.  She reports no vaginal bleeding, no consistent contractions, no leakage of fluid, baby is moving around   See flow sheet for details.    A/P: Pregnancy at 7420w1d.  Doing well.   Pregnancy issues include sometimes feels like tummy gets hard, for about less than a minute - that has happened 1-2 times.    Indicates having vivid dreams: states that she previously has been depressed though denies any depression or SI currently. She is worried that these of the drains have something to do with her mental state. She was previously referred to psychiatry for this however never went.   Infant feeding choice: breast feeding  Contraception choice Nexplanon  Vs. Depo Infant circumcision desired no   FH 29 cm  FHT 145 bpm   Tdapwas not given today. Prescription was given for patient to get TDAP at the health department  1 hour glucola, CBC, RPR, and HIV were done today.   Pregnancy medical home forms were done today and reviewed.   RH status was reviewed and pt does not need Rhogam.  Rhogam was not given today.    Childbirth and education classes were not offered. Preterm labor precautions reviewed. Kick counts reviewed. Follow up 2 weeks.  Vivid dream Has always had vivid dreams, recently more vivid than normal. Does not feel she able to get good rest. The patient at previous office and was supposed to be seen by psychiatry for vivid dreams and depression. She currently denies any depression or SI. Patient to 9 is a 5 -was not scored for sleep issues as this was due to the vivid dreams. Would like to see psychiatry  - [facility]  - Counseled patient to decrease TV to stop any stimulating affects.

## 2016-09-01 LAB — CBC
HEMATOCRIT: 35.3 % (ref 34.0–46.6)
HEMOGLOBIN: 11.7 g/dL (ref 11.1–15.9)
MCH: 29.9 pg (ref 26.6–33.0)
MCHC: 33.1 g/dL (ref 31.5–35.7)
MCV: 90 fL (ref 79–97)
Platelets: 307 10*3/uL (ref 150–379)
RBC: 3.91 x10E6/uL (ref 3.77–5.28)
RDW: 13.5 % (ref 12.3–15.4)
WBC: 8.9 10*3/uL (ref 3.4–10.8)

## 2016-09-01 LAB — RPR: RPR Ser Ql: NONREACTIVE

## 2016-09-01 LAB — HIV ANTIBODY (ROUTINE TESTING W REFLEX): HIV SCREEN 4TH GENERATION: NONREACTIVE

## 2016-09-18 ENCOUNTER — Ambulatory Visit (INDEPENDENT_AMBULATORY_CARE_PROVIDER_SITE_OTHER): Payer: Self-pay | Admitting: Internal Medicine

## 2016-09-18 VITALS — BP 100/60 | HR 101 | Temp 97.9°F | Wt 153.2 lb

## 2016-09-18 DIAGNOSIS — F518 Other sleep disorders not due to a substance or known physiological condition: Secondary | ICD-10-CM

## 2016-09-18 DIAGNOSIS — R6889 Other general symptoms and signs: Secondary | ICD-10-CM

## 2016-09-18 NOTE — Progress Notes (Signed)
Amy Tyler is a 36 y.o. G4P3003 at 3460w5d for routine follow up.  She reports no vaginal bleed, loss of fluid, or contractions. Indicates feeling fetal movement     See flow sheet for details.  A/P: Pregnancy at 6360w5d.  Doing well.   Pregnancy issues include none   Infant feeding choice breast and formula  Contraception choice: Nexoplan Infant circumcision desired no  Tdap prescription was given. Patient plans to go the health department in the next week to get this immunization.  GBS/GC/CZ testing was not performed today.  Vivid dream Has not yet followed up with psychiatry, however plans to in the near future. Reassured patient again that vivid dreams will not harm the child as she states when she wakes up from a dream she feels baby moving around and that worries her that baby is getting stressed.    Preterm labor precautions reviewed. Safe sleep discussed. Kick counts reviewed. Follow up 2 weeks.

## 2016-09-18 NOTE — Patient Instructions (Signed)

## 2016-09-19 NOTE — Assessment & Plan Note (Addendum)
Has not yet followed up with psychiatry, however plans to in the near future. Reassured patient again that vivid dreams will not harm the child as she states when she wakes up from a dream she feels baby moving around and that worries her that baby is getting stressed.

## 2016-10-02 ENCOUNTER — Ambulatory Visit (INDEPENDENT_AMBULATORY_CARE_PROVIDER_SITE_OTHER): Payer: Self-pay | Admitting: Internal Medicine

## 2016-10-02 ENCOUNTER — Encounter: Payer: Self-pay | Admitting: Internal Medicine

## 2016-10-02 VITALS — BP 108/68 | HR 95 | Temp 97.9°F | Wt 154.0 lb

## 2016-10-02 DIAGNOSIS — Z3493 Encounter for supervision of normal pregnancy, unspecified, third trimester: Secondary | ICD-10-CM

## 2016-10-02 NOTE — Patient Instructions (Signed)
Tercer trimestre de embarazo (Third Trimester of Pregnancy) El tercer trimestre comprende desde la semana29 hasta la semana42, es decir, desde el mes7 hasta el mes9. El tercer trimestre es un perodo en el que el feto crece rpidamente. Hacia el final del noveno mes, el feto mide alrededor de 20pulgadas (45cm) de largo y pesa entre 6 y 10 libras (2,700 y 4,500kg). CAMBIOS EN EL ORGANISMO Su organismo atraviesa por muchos cambios durante el embarazo, y estos varan de una mujer a otra.  Seguir aumentando de peso. Es de esperar que aumente entre 25 y 35libras (11 y 16kg) hacia el final del embarazo.  Podrn aparecer las primeras estras en las caderas, el abdomen y las mamas.  Puede tener necesidad de orinar con ms frecuencia porque el feto baja hacia la pelvis y ejerce presin sobre la vejiga.  Debido al embarazo podr sentir acidez estomacal con frecuencia.  Puede estar estreida, ya que ciertas hormonas enlentecen los movimientos de los msculos que empujan los desechos a travs de los intestinos.  Pueden aparecer hemorroides o abultarse e hincharse las venas (venas varicosas).  Puede sentir dolor plvico debido al aumento de peso y a que las hormonas del embarazo relajan las articulaciones entre los huesos de la pelvis. El dolor de espalda puede ser consecuencia de la sobrecarga de los msculos que soportan la postura.  Tal vez haya cambios en el cabello que pueden incluir su engrosamiento, crecimiento rpido y cambios en la textura. Adems, a algunas mujeres se les cae el cabello durante o despus del embarazo, o tienen el cabello seco o fino. Lo ms probable es que el cabello se le normalice despus del nacimiento del beb.  Las mamas seguirn creciendo y le dolern. A veces, puede haber una secrecin amarilla de las mamas llamada calostro.  El ombligo puede salir hacia afuera.  Puede sentir que le falta el aire debido a que se expande el tero.  Puede notar que el feto  "baja" o lo siente ms bajo, en el abdomen.  Puede tener una prdida de secrecin mucosa con sangre. Esto suele ocurrir en el trmino de unos pocos das a una semana antes de que comience el trabajo de parto.  El cuello del tero se vuelve delgado y blando (se borra) cerca de la fecha de parto. QU DEBE ESPERAR EN LOS EXMENES PRENATALES Le harn exmenes prenatales cada 2semanas hasta la semana36. A partir de ese momento le harn exmenes semanales. Durante una visita prenatal de rutina:  La pesarn para asegurarse de que usted y el feto estn creciendo normalmente.  Le tomarn la presin arterial.  Le medirn el abdomen para controlar el desarrollo del beb.  Se escucharn los latidos cardacos fetales.  Se evaluarn los resultados de los estudios solicitados en visitas anteriores.  Le revisarn el cuello del tero cuando est prxima la fecha de parto para controlar si este se ha borrado. Alrededor de la semana36, el mdico le revisar el cuello del tero. Al mismo tiempo, realizar un anlisis de las secreciones del tejido vaginal. Este examen es para determinar si hay un tipo de bacteria, estreptococo Grupo B. El mdico le explicar esto con ms detalle. El mdico puede preguntarle lo siguiente:  Cmo le gustara que fuera el parto.  Cmo se siente.  Si siente los movimientos del beb.  Si ha tenido sntomas anormales, como prdida de lquido, sangrado, dolores de cabeza intensos o clicos abdominales.  Si est consumiendo algn producto que contenga tabaco, como cigarrillos, tabaco de mascar y   cigarrillos electrnicos.  Si tiene alguna pregunta. Otros exmenes o estudios de deteccin que pueden realizarse durante el tercer trimestre incluyen lo siguiente:  Anlisis de sangre para controlar los niveles de hierro (anemia).  Controles fetales para determinar su salud, nivel de actividad y crecimiento. Si tiene alguna enfermedad o hay problemas durante el embarazo, le harn  estudios.  Prueba del VIH (virus de inmunodeficiencia humana). Si corre un riesgo alto, pueden realizarle una prueba de deteccin del VIH durante el tercer trimestre del embarazo. FALSO TRABAJO DE PARTO Es posible que sienta contracciones leves e irregulares que finalmente desaparecen. Se llaman contracciones de Braxton Hicks o falso trabajo de parto. Las contracciones pueden durar horas, das o incluso semanas, antes de que el verdadero trabajo de parto se inicie. Si las contracciones ocurren a intervalos regulares, se intensifican o se hacen dolorosas, lo mejor es que la revise el mdico. SIGNOS DE TRABAJO DE PARTO  Clicos de tipo menstrual.  Contracciones cada 5minutos o menos.  Contracciones que comienzan en la parte superior del tero y se extienden hacia abajo, a la zona inferior del abdomen y la espalda.  Sensacin de mayor presin en la pelvis o dolor de espalda.  Una secrecin de mucosidad acuosa o con sangre que sale de la vagina. Si tiene alguno de estos signos antes de la semana37 del embarazo, llame a su mdico de inmediato. Debe concurrir al hospital para que la controlen inmediatamente. INSTRUCCIONES PARA EL CUIDADO EN EL HOGAR  Evite fumar, consumir hierbas, beber alcohol y tomar frmacos que no le hayan recetado. Estas sustancias qumicas afectan la formacin y el desarrollo del beb.  No consuma ningn producto que contenga tabaco, lo que incluye cigarrillos, tabaco de mascar y cigarrillos electrnicos. Si necesita ayuda para dejar de fumar, consulte al mdico. Puede recibir asesoramiento y otro tipo de recursos para dejar de fumar.  Siga las indicaciones del mdico en relacin con el uso de medicamentos. Durante el embarazo, hay medicamentos que son seguros de tomar y otros que no.  Haga ejercicio solamente como se lo haya indicado el mdico. Sentir clicos uterinos es un buen signo para detener la actividad fsica.  Contine comiendo alimentos sanos con  regularidad.  Use un sostn que le brinde buen soporte si le duelen las mamas.  No se d baos de inmersin en agua caliente, baos turcos ni saunas.  Use el cinturn de seguridad en todo momento mientras conduce.  No coma carne cruda ni queso sin cocinar; evite el contacto con las bandejas sanitarias de los gatos y la tierra que estos animales usan. Estos elementos contienen grmenes que pueden causar defectos congnitos en el beb.  Tome las vitaminas prenatales.  Tome entre 1500 y 2000mg de calcio diariamente comenzando en la semana20 del embarazo hasta el parto.  Si est estreida, pruebe un laxante suave (si el mdico lo autoriza). Consuma ms alimentos ricos en fibra, como vegetales y frutas frescos y cereales integrales. Beba gran cantidad de lquido para mantener la orina de tono claro o color amarillo plido.  Dese baos de asiento con agua tibia para aliviar el dolor o las molestias causadas por las hemorroides. Use una crema para las hemorroides si el mdico la autoriza.  Si tiene venas varicosas, use medias de descanso. Eleve los pies durante 15minutos, 3 o 4veces por da. Limite el consumo de sal en su dieta.  Evite levantar objetos pesados, use zapatos de tacones bajos y mantenga una buena postura.  Descanse con las piernas elevadas si tiene   calambres o dolor de cintura.  Visite a su dentista si no lo ha hecho durante el embarazo. Use un cepillo de dientes blando para higienizarse los dientes y psese el hilo dental con suavidad.  Puede seguir manteniendo relaciones sexuales, a menos que el mdico le indique lo contrario.  No haga viajes largos excepto que sea absolutamente necesario y solo con la autorizacin del mdico.  Tome clases prenatales para entender, practicar y hacer preguntas sobre el trabajo de parto y el parto.  Haga un ensayo de la partida al hospital.  Prepare el bolso que llevar al hospital.  Prepare la habitacin del beb.  Concurra a todas  las visitas prenatales segn las indicaciones de su mdico.  SOLICITE ATENCIN MDICA SI:  No est segura de que est en trabajo de parto o de que ha roto la bolsa de las aguas.  Tiene mareos.  Siente clicos leves, presin en la pelvis o dolor persistente en el abdomen.  Tiene nuseas, vmitos o diarrea persistentes.  Observa una secrecin vaginal con mal olor.  Siente dolor al orinar.  SOLICITE ATENCIN MDICA DE INMEDIATO SI:  Tiene fiebre.  Tiene una prdida de lquido por la vagina.  Tiene sangrado o pequeas prdidas vaginales.  Siente dolor intenso o clicos en el abdomen.  Sube o baja de peso rpidamente.  Tiene dificultad para respirar y siente dolor de pecho.  Sbitamente se le hinchan mucho el rostro, las manos, los tobillos, los pies o las piernas.  No ha sentido los movimientos del beb durante una hora.  Siente un dolor de cabeza intenso que no se alivia con medicamentos.  Su visin se modifica.  Esta informacin no tiene como fin reemplazar el consejo del mdico. Asegrese de hacerle al mdico cualquier pregunta que tenga. Document Released: 11/23/2004 Document Revised: 03/06/2014 Document Reviewed: 04/16/2012 Elsevier Interactive Patient Education  2017 [employer]  

## 2016-10-02 NOTE — Progress Notes (Signed)
Amy PittsMaricela Tyler is a 36 y.o. G4P3003 at 3924w5d for routine follow up.  She reports She reports no vaginal bleed, loss of fluid, or contractions. Indicates feeling fetal movement     See flow sheet for details.  A/P: Pregnancy at 1424w5d.  Doing well.   Pregnancy issues include none   Infant feeding choice breast milk and formula Contraception choice Nexplanon Infant circumcision desired no  [facility] . GBS/GC/CZ testing was not performed today.  Preterm labor precautions reviewed. Safe sleep discussed. Kick counts reviewed. Follow up 2 weeks.

## 2016-10-19 ENCOUNTER — Ambulatory Visit (INDEPENDENT_AMBULATORY_CARE_PROVIDER_SITE_OTHER): Payer: Self-pay | Admitting: Internal Medicine

## 2016-10-19 VITALS — BP 108/60 | HR 100 | Temp 97.8°F | Wt 157.0 lb

## 2016-10-19 DIAGNOSIS — Z3493 Encounter for supervision of normal pregnancy, unspecified, third trimester: Secondary | ICD-10-CM

## 2016-10-19 NOTE — Progress Notes (Signed)
Amy Tyler is a 36 y.o. G4P3003 at [redacted]w[redacted]d for routine follow up.  She reports no concerns. No vaginal bleeding, no leakage of fluid, no contractions. Positive for good fetal movement   See flow sheet for details.  A/P: Pregnancy at [redacted]w[redacted]d.  Doing well.   Pregnancy issues include none   Infant feeding choice: breast milk and formula  Contraception choice: Nexplanon  Infant circumcision desired no Cephalic via ultrasound  Tdap was given at the health department . GBS/GC/CZ testing was not performed today. Needs to be collected at next visit    Preterm labor precautions reviewed. Safe sleep discussed. Kick counts reviewed. Follow up 2 weeks.

## 2016-10-19 NOTE — Patient Instructions (Signed)
Third Trimester of Pregnancy The third trimester is from week 28 through week 40 (months 7 through 9). The third trimester is a time when the unborn baby (fetus) is growing rapidly. At the end of the ninth month, the fetus is about 20 inches in length and weighs 6-10 pounds. Body changes during your third trimester Your body will continue to go through many changes during pregnancy. The changes vary from woman to woman. During the third trimester:  Your weight will continue to increase. You can expect to gain 25-35 pounds (11-16 kg) by the end of the pregnancy.  You may begin to get stretch marks on your hips, abdomen, and breasts.  You may urinate more often because the fetus is moving lower into your pelvis and pressing on your bladder.  You may develop or continue to have heartburn. This is caused by increased hormones that slow down muscles in the digestive tract.  You may develop or continue to have constipation because increased hormones slow digestion and cause the muscles that push waste through your intestines to relax.  You may develop hemorrhoids. These are swollen veins (varicose veins) in the rectum that can itch or be painful.  You may develop swollen, bulging veins (varicose veins) in your legs.  You may have increased body aches in the pelvis, back, or thighs. This is due to weight gain and increased hormones that are relaxing your joints.  You may have changes in your hair. These can include thickening of your hair, rapid growth, and changes in texture. Some women also have hair loss during or after pregnancy, or hair that feels dry or thin. Your hair will most likely return to normal after your baby is born.  Your breasts will continue to grow and they will continue to become tender. A yellow fluid (colostrum) may leak from your breasts. This is the first milk you are producing for your baby.  Your belly button may stick out.  You may notice more swelling in your hands,  face, or ankles.  You may have increased tingling or numbness in your hands, arms, and legs. The skin on your belly may also feel numb.  You may feel short of breath because of your expanding uterus.  You may have more problems sleeping. This can be caused by the size of your belly, increased need to urinate, and an increase in your body's metabolism.  You may notice the fetus "dropping," or moving lower in your abdomen (lightening).  You may have increased vaginal discharge.  You may notice your joints feel loose and you may have pain around your pelvic bone.  What to expect at prenatal visits You will have prenatal exams every 2 weeks until week 36. Then you will have weekly prenatal exams. During a routine prenatal visit:  You will be weighed to make sure you and the baby are growing normally.  Your blood pressure will be taken.  Your abdomen will be measured to track your baby's growth.  The fetal heartbeat will be listened to.  Any test results from the previous visit will be discussed.  You may have a cervical check near your due date to see if your cervix has softened or thinned (effaced).  You will be tested for Group B streptococcus. This happens between 35 and 37 weeks.  Your health care provider may ask you:  What your birth plan is.  How you are feeling.  If you are feeling the baby move.  If you have had   any abnormal symptoms, such as leaking fluid, bleeding, severe headaches, or abdominal cramping.  If you are using any tobacco products, including cigarettes, chewing tobacco, and electronic cigarettes.  If you have any questions.  Other tests or screenings that may be performed during your third trimester include:  Blood tests that check for low iron levels (anemia).  Fetal testing to check the health, activity level, and growth of the fetus. Testing is done if you have certain medical conditions or if there are problems during the  pregnancy.  Nonstress test (NST). This test checks the health of your baby to make sure there are no signs of problems, such as the baby not getting enough oxygen. During this test, a belt is placed around your belly. The baby is made to move, and its heart rate is monitored during movement.  What is false labor? False labor is a condition in which you feel small, irregular tightenings of the muscles in the womb (contractions) that usually go away with rest, changing position, or drinking water. These are called Braxton Hicks contractions. Contractions may last for hours, days, or even weeks before true labor sets in. If contractions come at regular intervals, become more frequent, increase in intensity, or become painful, you should see your health care provider. What are the signs of labor?  Abdominal cramps.  Regular contractions that start at 10 minutes apart and become stronger and more frequent with time.  Contractions that start on the top of the uterus and spread down to the lower abdomen and back.  Increased pelvic pressure and dull back pain.  A watery or bloody mucus discharge that comes from the vagina.  Leaking of amniotic fluid. This is also known as your "water breaking." It could be a slow trickle or a gush. Let your health care provider know if it has a color or strange odor. If you have any of these signs, call your health care provider right away, even if it is before your due date. Follow these instructions at home: Medicines  Follow your health care provider's instructions regarding medicine use. Specific medicines may be either safe or unsafe to take during pregnancy.  Take a prenatal vitamin that contains at least 600 micrograms (mcg) of folic acid.  If you develop constipation, try taking a stool softener if your health care provider approves. Eating and drinking  Eat a balanced diet that includes fresh fruits and vegetables, whole grains, good sources of protein  such as meat, eggs, or tofu, and low-fat dairy. Your health care provider will help you determine the amount of weight gain that is right for you.  Avoid raw meat and uncooked cheese. These carry germs that can cause birth defects in the baby.  If you have low calcium intake from food, talk to your health care provider about whether you should take a daily calcium supplement.  Eat four or five small meals rather than three large meals a day.  Limit foods that are high in fat and processed sugars, such as fried and sweet foods.  To prevent constipation: ? Drink enough fluid to keep your urine clear or pale yellow. ? Eat foods that are high in fiber, such as fresh fruits and vegetables, whole grains, and beans. Activity  Exercise only as directed by your health care provider. Most women can continue their usual exercise routine during pregnancy. Try to exercise for 30 minutes at least 5 days a week. Stop exercising if you experience uterine contractions.  Avoid heavy   lifting.  Do not exercise in extreme heat or humidity, or at high altitudes.  Wear low-heel, comfortable shoes.  Practice good posture.  You may continue to have sex unless your health care provider tells you otherwise. Relieving pain and discomfort  Take frequent breaks and rest with your legs elevated if you have leg cramps or low back pain.  Take warm sitz baths to soothe any pain or discomfort caused by hemorrhoids. Use hemorrhoid cream if your health care provider approves.  Wear a good support bra to prevent discomfort from breast tenderness.  If you develop varicose veins: ? Wear support pantyhose or compression stockings as told by your healthcare provider. ? Elevate your feet for 15 minutes, 3-4 times a day. Prenatal care  Write down your questions. Take them to your prenatal visits.  Keep all your prenatal visits as told by your health care provider. This is important. Safety  Wear your seat belt at  all times when driving.  Make a list of emergency phone numbers, including numbers for family, friends, the hospital, and police and fire departments. General instructions  Avoid cat litter boxes and soil used by cats. These carry germs that can cause birth defects in the baby. If you have a cat, ask someone to clean the litter box for you.  Do not travel far distances unless it is absolutely necessary and only with the approval of your health care provider.  Do not use hot tubs, steam rooms, or saunas.  Do not drink alcohol.  Do not use any products that contain nicotine or tobacco, such as cigarettes and e-cigarettes. If you need help quitting, ask your health care provider.  Do not use any medicinal herbs or unprescribed drugs. These chemicals affect the formation and growth of the baby.  Do not douche or use tampons or scented sanitary pads.  Do not cross your legs for long periods of time.  To prepare for the arrival of your baby: ? Take prenatal classes to understand, practice, and ask questions about labor and delivery. ? Make a trial run to the hospital. ? Visit the hospital and tour the maternity area. ? Arrange for maternity or paternity leave through employers. ? Arrange for family and friends to take care of pets while you are in the hospital. ? Purchase a rear-facing car seat and make sure you know how to install it in your car. ? Pack your hospital bag. ? Prepare the baby's nursery. Make sure to remove all pillows and stuffed animals from the baby's crib to prevent suffocation.  Visit your dentist if you have not gone during your pregnancy. Use a soft toothbrush to brush your teeth and be gentle when you floss. Contact a health care provider if:  You are unsure if you are in labor or if your water has broken.  You become dizzy.  You have mild pelvic cramps, pelvic pressure, or nagging pain in your abdominal area.  You have lower back pain.  You have persistent  nausea, vomiting, or diarrhea.  You have an unusual or bad smelling vaginal discharge.  You have pain when you urinate. Get help right away if:  Your water breaks before 37 weeks.  You have regular contractions less than 5 minutes apart before 37 weeks.  You have a fever.  You are leaking fluid from your vagina.  You have spotting or bleeding from your vagina.  You have severe abdominal pain or cramping.  You have rapid weight loss or weight gain.    You have shortness of breath with chest pain.  You notice sudden or extreme swelling of your face, hands, ankles, feet, or legs.  Your baby makes fewer than 10 movements in 2 hours.  You have severe headaches that do not go away when you take medicine.  You have vision changes. Summary  The third trimester is from week 28 through week 40, months 7 through 9. The third trimester is a time when the unborn baby (fetus) is growing rapidly.  During the third trimester, your discomfort may increase as you and your baby continue to gain weight. You may have abdominal, leg, and back pain, sleeping problems, and an increased need to urinate.  During the third trimester your breasts will keep growing and they will continue to become tender. A yellow fluid (colostrum) may leak from your breasts. This is the first milk you are producing for your baby.  False labor is a condition in which you feel small, irregular tightenings of the muscles in the womb (contractions) that eventually go away. These are called Braxton Hicks contractions. Contractions may last for hours, days, or even weeks before true labor sets in.  Signs of labor can include: abdominal cramps; regular contractions that start at 10 minutes apart and become stronger and more frequent with time; watery or bloody mucus discharge that comes from the vagina; increased pelvic pressure and dull back pain; and leaking of amniotic fluid. This information is not intended to replace advice  given to you by your health care provider. Make sure you discuss any questions you have with your health care provider. Document Released: 02/07/2001 Document Revised: 07/22/2015 Document Reviewed: 04/16/2012 [employer] Interactive Patient Education  2017 [employer]  

## 2016-11-07 ENCOUNTER — Ambulatory Visit (INDEPENDENT_AMBULATORY_CARE_PROVIDER_SITE_OTHER): Payer: Self-pay | Admitting: Internal Medicine

## 2016-11-07 VITALS — BP 102/70 | HR 81 | Temp 97.9°F | Wt 159.0 lb

## 2016-11-07 DIAGNOSIS — Z3493 Encounter for supervision of normal pregnancy, unspecified, third trimester: Secondary | ICD-10-CM

## 2016-11-07 LAB — OB RESULTS CONSOLE GBS: STREP GROUP B AG: POSITIVE

## 2016-11-07 LAB — OB RESULTS CONSOLE GC/CHLAMYDIA: GC PROBE AMP, GENITAL: NEGATIVE

## 2016-11-07 NOTE — Patient Instructions (Signed)
Contracciones de Designer, multimedia (Braxton Hicks Contractions) Durante el [place], pueden presentarse contracciones uterinas que no siempre indican que est en [place]. QU SON LAS CONTRACCIONES DE BRAXTON HICKS? Las [employer] se presentan antes del [place] de [place] se conocen como contracciones de [place] o falso trabajo de [place]. Hacia el final del embarazo (32 a 34semanas), estas contracciones pueden aparecen con ms frecuencia y volverse ms intensas. No corresponden al Aleen Campi de parto verdadero porque estas contracciones no producen el agrandamiento (la dilatacin) y el afinamiento del cuello del tero. Algunas veces, es difcil distinguirlas del trabajo de parto verdadero porque en algunos casos pueden ser [employer], y las personas tienen diferentes niveles de tolerancia al Merck & [employer]. No debe sentirse avergonzada si concurre al hospital con falso trabajo de [place]. En ocasiones, la nica forma de saber si el trabajo de parto es verdadero es que el mdico determine si hay cambios en el cuello del tero. Si no hay problemas prenatales u otras complicaciones de salud asociadas con el embarazo, no habr inconvenientes si la envan a su casa con falso trabajo de parto y espera que comience el verdadero. CMO DIFERENCIAR EL TRABAJO DE PARTO FALSO DEL VERDADERO Falso trabajo de parto  Las contracciones del falso trabajo de parto duran menos y no son tan intensas como las verdaderas.  Generalmente son irregulares.  A menudo, se sienten en la parte delantera de la parte baja del abdomen y en la ingle,  y pueden desaparecer cuando camina o cambia de posicin mientras est acostada.  Las contracciones se vuelven ms dbiles y su duracin es Adult nurse a medida que el tiempo transcurre.  Por lo general, no se hacen progresivamente ms intensas, regulares y Herbalist entre s como en el caso del [place] de parto verdadero. Theodis Blaze de parto  Las contracciones del verdadero  trabajo de parto duran de 30 a 70segundos, son muy regulares y suelen volverse ms intensas, y [place] su frecuencia.  No desaparecen cuando camina.  La molestia generalmente se siente en la parte superior del tero y se extiende hacia la zona inferior del abdomen y [employer]in cintura.  El mdico podr examinarla para determinar si el trabajo de parto es verdadero. El examen mostrar si el cuello del tero se est dilatando y [place]. LO QUE DEBE RECORDAR  Contine haciendo los ejercicios habituales y siga otras indicaciones que el mdico le d.  Tome todos los medicamentos como le indic el mdico.  Oceanographer a las visitas prenatales regulares.  Coma y beba con moderacin si cree que est en trabajo de parto.  Si las contracciones de [employer] provocan incomodidad: ? Cambie de posicin: si est acostada o descansando, camine; si est caminando, descanse. ? Sintese y descanse en una baera con agua tibia. ? Beba 2 o 3vasos de agua. La deshidratacin puede provocar contracciones. ? Respire lenta y profundamente varias veces por hora.  CUNDO DEBO BUSCAR ASISTENCIA MDICA INMEDIATA? Solicite atencin mdica de inmediato si:  Las contracciones se intensifican, se hacen ms regulares y Arboriculturist s.  Tiene una prdida de lquido por la vagina.  Tiene fiebre.  Elimina mucosidad manchada con [place].  Tiene una hemorragia vaginal abundante.  Tiene dolor abdominal permanente.  Tiene un dolor en la zona lumbar que nunca tuvo antes.  Siente que la cabeza del beb empuja hacia abajo y ejerce presin en la zona plvica.  El beb no se mueve Dentist. Esta informacin no tiene Theme park manager el consejo  del mdico. Asegrese de hacerle al mdico cualquier pregunta que tenga. Document Released: 11/23/2004 Document Revised: 06/07/2015 Document Reviewed: 11/25/2012 Elsevier Interactive Patient Education  2017 Elsevier Inc.   Systems analystTercer trimestre de  Psychiatristembarazo (Third Trimester of Pregnancy) El tercer trimestre comprende desde la semana29 hasta la semana42, es decir, desde el mes7 hasta el [address]. El tercer trimestre es un perodo en el que el feto crece rpidamente. Hacia el final del noveno mes, el feto mide alrededor de 20pulgadas (45cm) de largo y pesa entre 6 y 10 libras (2,700 y 74,500kg). CAMBIOS EN EL ORGANISMO Su organismo atraviesa por muchos cambios durante el Dundasembarazo, y estos varan de Neomia Dearuna mujer a Educational psychologistotra.  Seguir American Standard Companiesaumentando de peso. Es de esperar que aumente entre 25 y 35libras (11 y 16kg) hacia el final del Psychiatristembarazo.  Podrn aparecer las primeras Albertson'sestras en las caderas, el abdomen y las Tigertonmamas.  Puede tener necesidad de Geographical information systems officerorinar con ms frecuencia porque el feto baja hacia la pelvis y ejerce presin sobre la vejiga.  Debido al Vanetta Muldersembarazo podr sentir Anthoney Haradaacidez estomacal con frecuencia.  Puede estar estreida, ya que ciertas hormonas enlentecen los movimientos de los msculos que New York Life Insuranceempujan los desechos a travs de los intestinos.  Pueden aparecer hemorroides o abultarse e hincharse las venas (venas varicosas).  Puede sentir dolor plvico debido al Con-wayaumento de peso y a que las hormonas del Management consultantembarazo relajan las articulaciones entre los huesos de la pelvis. El dolor de espalda puede ser consecuencia de la sobrecarga de los msculos que soportan la Garypostura.  Tal vez haya cambios en el cabello que pueden incluir su engrosamiento, crecimiento rpido y cambios en la textura. Adems, a algunas mujeres se les cae el cabello durante o despus del embarazo, o tienen el cabello seco o fino. Lo ms probable es que el cabello se le normalice despus del nacimiento del beb.  Las ConAgra Foodsmamas seguirn creciendo y Development worker, communityle dolern. A veces, puede haber una secrecin amarilla de las mamas llamada calostro.  El ombligo puede salir hacia afuera.  Puede sentir que le falta el aire debido a que se expande el tero.  Puede notar que el feto "baja" o lo siente ms  bajo, en el abdomen.  Puede tener una prdida de secrecin mucosa con sangre. Esto suele ocurrir en el trmino de unos [address]os das a una semana antes de que comience el Blooming[place]etrabajo de [place] Parkparto.  El cuello del tero se vuelve delgado y blando (se borra) cerca de la fecha de Alpharettaparto. QU DEBE ESPERAR EN LOS EXMENES PRENATALES Le harn exmenes prenatales cada 2semanas hasta la semana36. A partir de ese momento le harn exmenes semanales. Durante una visita prenatal de rutina:  La pesarn para asegurarse de que usted y el feto estn creciendo normalmente.  Le tomarn la presin arterial.  Le medirn el abdomen para controlar el desarrollo del beb.  Se escucharn los latidos cardacos fetales.  Se evaluarn los resultados de los estudios solicitados en visitas anteriores.  Le revisarn el cuello del tero cuando est prxima la fecha de parto para controlar si este se ha borrado. Alrededor de la semana36, el mdico le revisar el cuello del tero. Al mismo tiempo, realizar un anlisis de las secreciones del tejido vaginal. Este examen es para determinar si hay un tipo de bacteria, estreptococo Grupo B. El mdico le explicar esto con ms detalle. El mdico puede preguntarle lo siguiente:  Cmo le gustara que fuera el Gibsontonparto.  Cmo se siente.  Si siente los movimientos del beb.  Si  ha tenido sntomas anormales, como prdida de lquido, [place], dolores de cabeza intensos o clicos abdominales.  Si est consumiendo algn producto que contenga tabaco, como cigarrillos, tabaco de Theatre manager y Administrator, Civil Service.  Si tiene Colgate-Palmolive. Otros exmenes o estudios de deteccin que pueden realizarse durante el tercer trimestre incluyen lo siguiente:  Anlisis de sangre para controlar los niveles de hierro (anemia).  Controles fetales para determinar su salud, nivel de [place] y Designer, jewellery. Si tiene [place] enfermedad o hay problemas durante el embarazo, le harn estudios.  Prueba del  VIH (virus de inmunodeficiencia humana). Si corre Chiropodist, pueden realizarle una prueba de deteccin del VIH durante el tercer trimestre del embarazo. FALSO TRABAJO DE PARTO Es posible que sienta contracciones leves e irregulares que finalmente desaparecen. Se llaman contracciones de [address] o falso trabajo de [place]. Las [employer] Bancorp pueden durar horas, [address] o incluso semanas, antes de que el verdadero trabajo de parto se inicie. Si las contracciones ocurren a intervalos regulares, se intensifican o se hacen dolorosas, lo mejor es que la revise el mdico. SIGNOS DE TRABAJO DE PARTO  Clicos de tipo menstrual.  Contracciones cada o menos.  Contracciones que comienzan en la parte superior del tero y se extienden hacia abajo, a la zona inferior del abdomen y la espalda.  Sensacin de mayor presin en la pelvis o dolor de espalda.  Una secrecin de mucosidad acuosa o con sangre que sale de la vagina. Si tiene alguno de estos signos antes de la semana37 del Psychiatrist, llame a su mdico de inmediato. Debe concurrir al hospital para que la controlen inmediatamente. INSTRUCCIONES PARA EL CUIDADO EN EL HOGAR  Evite fumar, consumir hierbas, beber alcohol y tomar frmacos que no le hayan recetado. Estas sustancias qumicas afectan la formacin y el desarrollo del beb.  No consuma ningn producto que contenga tabaco, lo que incluye cigarrillos, tabaco de Theatre manager y Administrator, Civil Service. Si necesita ayuda para dejar de fumar, consulte al American Express. Puede recibir asesoramiento y otro tipo de recursos para dejar de fumar.  Siga las indicaciones del mdico en relacin con el uso de medicamentos. Durante el embarazo, hay medicamentos que son seguros de tomar y otros que no.  Haga ejercicio solamente como se lo haya indicado el mdico. Sentir clicos uterinos es un buen signo para Restaurant manager, fast food actividad fsica.  Contine comiendo alimentos sanos con regularidad.  Use un sostn que le  brinde buen soporte si le Altria Group.  No se d baos de inmersin en agua caliente, baos turcos ni saunas.  Use el cinturn de seguridad en todo momento mientras conduce.  No coma carne cruda ni queso sin cocinar; evite el contacto con las bandejas sanitarias de los gatos y la tierra que estos animales usan. Estos elementos contienen grmenes que pueden causar defectos congnitos en el beb.  Tome las vitaminas prenatales.  Tome entre 1500 y  de calcio diariamente comenzando en la semana20 del embarazo [place].  Si est estreida, pruebe un laxante suave (si el mdico lo autoriza). Consuma ms alimentos ricos en fibra, como vegetales y frutas frescos y Radiation protection practitioner. Beba gran cantidad de lquido para mantener la orina de tono claro o color amarillo plido.  Dese baos de asiento con agua tibia para Engineer, materials o las molestias causadas por las hemorroides. Use una crema para las hemorroides si el mdico la autoriza.  Si tiene venas varicosas, use medias de descanso. Eleve los pies durante , 3 o 4veces por da.  Limite el consumo de sal en su dieta.  Evite levantar objetos pesados, use zapatos de tacones bajos y [place].  Descanse con las piernas elevadas si tiene calambres o dolor de cintura.  Visite a su dentista si no lo ha Occupational hygienist. Use un cepillo de dientes blando para higienizarse los dientes y psese el hilo dental con suavidad.  Puede seguir Calpine Corporation, a menos que el mdico le indique lo contrario.  No haga viajes largos excepto que sea absolutamente necesario y solo con la autorizacin del mdico.  Tome clases prenatales para Financial trader, Education administrator y hacer preguntas sobre el [place] de parto y [place].  Haga un ensayo de la partida al hospital.  Prepare el bolso que llevar al hospital.  Prepare la habitacin del beb.  Concurra a todas las visitas prenatales segn las  indicaciones de su mdico.  SOLICITE ATENCIN MDICA SI:  No est segura de que est en trabajo de parto o de que ha roto la bolsa de las aguas.  Tiene mareos.  Siente clicos leves, presin en la pelvis o dolor persistente en el abdomen.  Tiene nuseas, vmitos o diarrea persistentes.  Brett Fairy secrecin vaginal con mal olor.  Siente dolor al ConocoPhillips.  SOLICITE ATENCIN MDICA DE INMEDIATO SI:  Tiene fiebre.  Tiene una prdida de lquido por la vagina.  Tiene sangrado o pequeas prdidas vaginales.  Siente dolor intenso o clicos en el abdomen.  Sube o baja de peso rpidamente.  Tiene dificultad para respirar y siente dolor de pecho.  Sbitamente se le hinchan mucho el rostro, las Maria Antonia, los tobillos, los pies o las piernas.  No ha sentido los movimientos del beb durante Georgianne Fick.  Siente un dolor de cabeza intenso que no se alivia con medicamentos.  Su visin se modifica.  Esta informacin no tiene Theme park manager el consejo del mdico. Asegrese de hacerle al mdico cualquier pregunta que tenga. Document Released: 11/23/2004 Document Revised: 03/06/2014 Document Reviewed: 04/16/2012 Elsevier Interactive Patient Education  2017 Arvin[employer].

## 2016-11-07 NOTE — Progress Notes (Signed)
Amy PittsMaricela Tyler is a 36 y.o. [id number] at 7210w6d for routine follow up.  She reportsno concerns. No vaginal bleeding, no leakage of fluid, no significant contractions. Patient does not Braxton Hicks contractions - abdominal tightening that occurs last for less than 30 minutes. Positive for good fetal movement.   See flow sheet for details.  A/P: Pregnancy at 910w6d.  Doing well.   Pregnancy issues include none  Checked last visit for cephalic ultrasound   Infant feeding choice: breast milk and formula  Contraception choice: Nexplanon Infant circumcision desired no GBS/GC/CZ results were obtained today. Need to be reviewed at next week  Labor precautions reviewed. Kick counts reviewed.

## 2016-11-08 LAB — CERVICOVAGINAL ANCILLARY ONLY
CHLAMYDIA, DNA PROBE: NEGATIVE
Neisseria Gonorrhea: NEGATIVE

## 2016-11-10 ENCOUNTER — Telehealth: Payer: Self-pay | Admitting: Internal Medicine

## 2016-11-10 LAB — CULTURE, BETA STREP (GROUP B ONLY): STREP GP B CULTURE: POSITIVE — AB

## 2016-11-10 NOTE — Telephone Encounter (Deleted)
Please call patient and let her know that her Gonorrhea and Chlamydia were negative. Thanks Nike Southers

## 2016-11-10 NOTE — Telephone Encounter (Signed)
Will discuss results a next prenatal visit

## 2016-11-14 ENCOUNTER — Ambulatory Visit (INDEPENDENT_AMBULATORY_CARE_PROVIDER_SITE_OTHER): Payer: Self-pay | Admitting: Internal Medicine

## 2016-11-14 DIAGNOSIS — F32A Depression, unspecified: Secondary | ICD-10-CM

## 2016-11-14 DIAGNOSIS — Z3493 Encounter for supervision of normal pregnancy, unspecified, third trimester: Secondary | ICD-10-CM

## 2016-11-14 DIAGNOSIS — B951 Streptococcus, group B, as the cause of diseases classified elsewhere: Secondary | ICD-10-CM

## 2016-11-14 DIAGNOSIS — F419 Anxiety disorder, unspecified: Secondary | ICD-10-CM

## 2016-11-14 DIAGNOSIS — F329 Major depressive disorder, single episode, unspecified: Secondary | ICD-10-CM

## 2016-11-14 NOTE — Patient Instructions (Signed)
Tercer trimestre de embarazo (Third Trimester of Pregnancy) El tercer trimestre comprende desde la semana29 hasta la semana42, es decir, desde el mes7 hasta el mes9. El tercer trimestre es un perodo en el que el feto crece rpidamente. Hacia el final del noveno mes, el feto mide alrededor de 20pulgadas (45cm) de largo y pesa entre 6 y 10 libras (2,700 y 4,500kg). CAMBIOS EN EL ORGANISMO Su organismo atraviesa por muchos cambios durante el embarazo, y estos varan de una mujer a otra.  Seguir aumentando de peso. Es de esperar que aumente entre 25 y 35libras (11 y 16kg) hacia el final del embarazo.  Podrn aparecer las primeras estras en las caderas, el abdomen y las mamas.  Puede tener necesidad de orinar con ms frecuencia porque el feto baja hacia la pelvis y ejerce presin sobre la vejiga.  Debido al embarazo podr sentir acidez estomacal con frecuencia.  Puede estar estreida, ya que ciertas hormonas enlentecen los movimientos de los msculos que empujan los desechos a travs de los intestinos.  Pueden aparecer hemorroides o abultarse e hincharse las venas (venas varicosas).  Puede sentir dolor plvico debido al aumento de peso y a que las hormonas del embarazo relajan las articulaciones entre los huesos de la pelvis. El dolor de espalda puede ser consecuencia de la sobrecarga de los msculos que soportan la postura.  Tal vez haya cambios en el cabello que pueden incluir su engrosamiento, crecimiento rpido y cambios en la textura. Adems, a algunas mujeres se les cae el cabello durante o despus del embarazo, o tienen el cabello seco o fino. Lo ms probable es que el cabello se le normalice despus del nacimiento del beb.  Las mamas seguirn creciendo y le dolern. A veces, puede haber una secrecin amarilla de las mamas llamada calostro.  El ombligo puede salir hacia afuera.  Puede sentir que le falta el aire debido a que se expande el tero.  Puede notar que el feto  "baja" o lo siente ms bajo, en el abdomen.  Puede tener una prdida de secrecin mucosa con sangre. Esto suele ocurrir en el trmino de unos pocos das a una semana antes de que comience el trabajo de parto.  El cuello del tero se vuelve delgado y blando (se borra) cerca de la fecha de parto. QU DEBE ESPERAR EN LOS EXMENES PRENATALES Le harn exmenes prenatales cada 2semanas hasta la semana36. A partir de ese momento le harn exmenes semanales. Durante una visita prenatal de rutina:  La pesarn para asegurarse de que usted y el feto estn creciendo normalmente.  Le tomarn la presin arterial.  Le medirn el abdomen para controlar el desarrollo del beb.  Se escucharn los latidos cardacos fetales.  Se evaluarn los resultados de los estudios solicitados en visitas anteriores.  Le revisarn el cuello del tero cuando est prxima la fecha de parto para controlar si este se ha borrado. Alrededor de la semana36, el mdico le revisar el cuello del tero. Al mismo tiempo, realizar un anlisis de las secreciones del tejido vaginal. Este examen es para determinar si hay un tipo de bacteria, estreptococo Grupo B. El mdico le explicar esto con ms detalle. El mdico puede preguntarle lo siguiente:  Cmo le gustara que fuera el parto.  Cmo se siente.  Si siente los movimientos del beb.  Si ha tenido sntomas anormales, como prdida de lquido, sangrado, dolores de cabeza intensos o clicos abdominales.  Si est consumiendo algn producto que contenga tabaco, como cigarrillos, tabaco de mascar y   cigarrillos electrnicos.  Si tiene alguna pregunta. Otros exmenes o estudios de deteccin que pueden realizarse durante el tercer trimestre incluyen lo siguiente:  Anlisis de sangre para controlar los niveles de hierro (anemia).  Controles fetales para determinar su salud, nivel de actividad y crecimiento. Si tiene alguna enfermedad o hay problemas durante el embarazo, le harn  estudios.  Prueba del VIH (virus de inmunodeficiencia humana). Si corre un riesgo alto, pueden realizarle una prueba de deteccin del VIH durante el tercer trimestre del embarazo. FALSO TRABAJO DE PARTO Es posible que sienta contracciones leves e irregulares que finalmente desaparecen. Se llaman contracciones de Braxton Hicks o falso trabajo de parto. Las contracciones pueden durar horas, das o incluso semanas, antes de que el verdadero trabajo de parto se inicie. Si las contracciones ocurren a intervalos regulares, se intensifican o se hacen dolorosas, lo mejor es que la revise el mdico. SIGNOS DE TRABAJO DE PARTO  Clicos de tipo menstrual.  Contracciones cada 5minutos o menos.  Contracciones que comienzan en la parte superior del tero y se extienden hacia abajo, a la zona inferior del abdomen y la espalda.  Sensacin de mayor presin en la pelvis o dolor de espalda.  Una secrecin de mucosidad acuosa o con sangre que sale de la vagina. Si tiene alguno de estos signos antes de la semana37 del embarazo, llame a su mdico de inmediato. Debe concurrir al hospital para que la controlen inmediatamente. INSTRUCCIONES PARA EL CUIDADO EN EL HOGAR  Evite fumar, consumir hierbas, beber alcohol y tomar frmacos que no le hayan recetado. Estas sustancias qumicas afectan la formacin y el desarrollo del beb.  No consuma ningn producto que contenga tabaco, lo que incluye cigarrillos, tabaco de mascar y cigarrillos electrnicos. Si necesita ayuda para dejar de fumar, consulte al mdico. Puede recibir asesoramiento y otro tipo de recursos para dejar de fumar.  Siga las indicaciones del mdico en relacin con el uso de medicamentos. Durante el embarazo, hay medicamentos que son seguros de tomar y otros que no.  Haga ejercicio solamente como se lo haya indicado el mdico. Sentir clicos uterinos es un buen signo para detener la actividad fsica.  Contine comiendo alimentos sanos con  regularidad.  Use un sostn que le brinde buen soporte si le duelen las mamas.  No se d baos de inmersin en agua caliente, baos turcos ni saunas.  Use el cinturn de seguridad en todo momento mientras conduce.  No coma carne cruda ni queso sin cocinar; evite el contacto con las bandejas sanitarias de los gatos y la tierra que estos animales usan. Estos elementos contienen grmenes que pueden causar defectos congnitos en el beb.  Tome las vitaminas prenatales.  Tome entre 1500 y 2000mg de calcio diariamente comenzando en la semana20 del embarazo hasta el parto.  Si est estreida, pruebe un laxante suave (si el mdico lo autoriza). Consuma ms alimentos ricos en fibra, como vegetales y frutas frescos y cereales integrales. Beba gran cantidad de lquido para mantener la orina de tono claro o color amarillo plido.  Dese baos de asiento con agua tibia para aliviar el dolor o las molestias causadas por las hemorroides. Use una crema para las hemorroides si el mdico la autoriza.  Si tiene venas varicosas, use medias de descanso. Eleve los pies durante 15minutos, 3 o 4veces por da. Limite el consumo de sal en su dieta.  Evite levantar objetos pesados, use zapatos de tacones bajos y mantenga una buena postura.  Descanse con las piernas elevadas si tiene   calambres o dolor de cintura.  Visite a su dentista si no lo ha hecho durante el embarazo. Use un cepillo de dientes blando para higienizarse los dientes y psese el hilo dental con suavidad.  Puede seguir manteniendo relaciones sexuales, a menos que el mdico le indique lo contrario.  No haga viajes largos excepto que sea absolutamente necesario y solo con la autorizacin del mdico.  Tome clases prenatales para entender, practicar y hacer preguntas sobre el trabajo de parto y el parto.  Haga un ensayo de la partida al hospital.  Prepare el bolso que llevar al hospital.  Prepare la habitacin del beb.  Concurra a todas  las visitas prenatales segn las indicaciones de su mdico.  SOLICITE ATENCIN MDICA SI:  No est segura de que est en trabajo de parto o de que ha roto la bolsa de las aguas.  Tiene mareos.  Siente clicos leves, presin en la pelvis o dolor persistente en el abdomen.  Tiene nuseas, vmitos o diarrea persistentes.  Observa una secrecin vaginal con mal olor.  Siente dolor al orinar.  SOLICITE ATENCIN MDICA DE INMEDIATO SI:  Tiene fiebre.  Tiene una prdida de lquido por la vagina.  Tiene sangrado o pequeas prdidas vaginales.  Siente dolor intenso o clicos en el abdomen.  Sube o baja de peso rpidamente.  Tiene dificultad para respirar y siente dolor de pecho.  Sbitamente se le hinchan mucho el rostro, las manos, los tobillos, los pies o las piernas.  No ha sentido los movimientos del beb durante una hora.  Siente un dolor de cabeza intenso que no se alivia con medicamentos.  Su visin se modifica.  Esta informacin no tiene como fin reemplazar el consejo del mdico. Asegrese de hacerle al mdico cualquier pregunta que tenga. Document Released: 11/23/2004 Document Revised: 03/06/2014 Document Reviewed: 04/16/2012 Elsevier Interactive Patient Education  2017 [employer]  

## 2016-11-14 NOTE — Progress Notes (Signed)
Amy Tyler is a 36 y.o. G4P3003 at [redacted]w[redacted]d for routine follow up. She reports No vaginal bleeding, no leakage of fluid, no significant contractions.+ Fetal movement. Continues to have Braxton Contractions.  See flow sheet for details.  A/P: Pregnancy at [redacted]w[redacted]d.  Doing well.   Pregnancy issues include None    Anxiety and depression Denies any symptoms of anxiety or depression during pregnancy. Has been in good mood. No excessive worries or sadness   Infant feeding choice breast milk and formula  Contraception choice Nexplanon Infant circumcision desired no GBS/GC/CZ results were reviewed today. GBS positive  Flu vaccine prescribed for patient to get at the health department today  Labor precautions reviewed. Kick counts reviewed.

## 2016-11-14 NOTE — Assessment & Plan Note (Addendum)
Denies any symptoms of anxiety or depression during pregnancy. Has been in good mood. No excessive worries or sadness

## 2016-11-16 ENCOUNTER — Inpatient Hospital Stay (HOSPITAL_COMMUNITY)
Admission: AD | Admit: 2016-11-16 | Discharge: 2016-11-18 | DRG: 775 | Disposition: A | Discharge status: Home / Self Care | Payer: Medicaid Other | Source: Ambulatory Visit | Attending: Obstetrics & Gynecology | Admitting: Obstetrics & Gynecology

## 2016-11-16 ENCOUNTER — Encounter (HOSPITAL_COMMUNITY): Payer: Self-pay

## 2016-11-16 DIAGNOSIS — O99824 Streptococcus B carrier state complicating childbirth: Principal | ICD-10-CM | POA: Diagnosis present

## 2016-11-16 DIAGNOSIS — O99344 Other mental disorders complicating childbirth: Secondary | ICD-10-CM | POA: Diagnosis present

## 2016-11-16 DIAGNOSIS — F329 Major depressive disorder, single episode, unspecified: Secondary | ICD-10-CM | POA: Diagnosis present

## 2016-11-16 DIAGNOSIS — O26893 Other specified pregnancy related conditions, third trimester: Secondary | ICD-10-CM | POA: Diagnosis present

## 2016-11-16 DIAGNOSIS — F419 Anxiety disorder, unspecified: Secondary | ICD-10-CM | POA: Diagnosis present

## 2016-11-16 DIAGNOSIS — Z3A39 39 weeks gestation of pregnancy: Secondary | ICD-10-CM | POA: Diagnosis not present

## 2016-11-16 DIAGNOSIS — O326XX0 Maternal care for compound presentation, not applicable or unspecified: Secondary | ICD-10-CM | POA: Diagnosis present

## 2016-11-16 DIAGNOSIS — Z3493 Encounter for supervision of normal pregnancy, unspecified, third trimester: Secondary | ICD-10-CM

## 2016-11-16 DIAGNOSIS — Z3490 Encounter for supervision of normal pregnancy, unspecified, unspecified trimester: Secondary | ICD-10-CM

## 2016-11-16 LAB — ABO/RH: ABO/RH(D): O POS

## 2016-11-16 LAB — CBC
HCT: 37.8 % (ref 36.0–46.0)
Hemoglobin: 13.1 g/dL (ref 12.0–15.0)
MCH: 29.9 pg (ref 26.0–34.0)
MCHC: 34.7 g/dL (ref 30.0–36.0)
MCV: 86.3 fL (ref 78.0–100.0)
Platelets: 214 10*3/uL (ref 150–400)
RBC: 4.38 MIL/uL (ref 3.87–5.11)
RDW: 13.9 % (ref 11.5–15.5)
WBC: 13 10*3/uL — ABNORMAL HIGH (ref 4.0–10.5)

## 2016-11-16 LAB — TYPE AND SCREEN
ABO/RH(D): O POS
ANTIBODY SCREEN: NEGATIVE

## 2016-11-16 LAB — RPR: RPR Ser Ql: NONREACTIVE

## 2016-11-16 MED ORDER — SENNOSIDES-DOCUSATE SODIUM 8.6-50 MG PO TABS
2.0000 | ORAL_TABLET | ORAL | Status: DC
  Administered 2016-11-17 (×2): 2 via ORAL
  Filled 2016-11-16 (×3): qty 2

## 2016-11-16 MED ORDER — SOD CITRATE-CITRIC ACID 500-334 MG/5ML PO SOLN: 30.0000 mL | ORAL | Status: DC | PRN

## 2016-11-16 MED ORDER — OXYTOCIN BOLUS FROM INFUSION
500.0000 mL | Freq: Once | INTRAVENOUS | Status: AC
  Administered 2016-11-16: 500 mL via INTRAVENOUS

## 2016-11-16 MED ORDER — OXYCODONE-ACETAMINOPHEN 5-325 MG PO TABS: 2.0000 | ORAL_TABLET | ORAL | Status: DC | PRN

## 2016-11-16 MED ORDER — PRENATAL MULTIVITAMIN CH
1.0000 | ORAL_TABLET | Freq: Every day | ORAL | Status: DC
  Administered 2016-11-16 – 2016-11-18 (×3): 1 via ORAL
  Filled 2016-11-16 (×3): qty 1

## 2016-11-16 MED ORDER — SODIUM CHLORIDE 0.9 % IV SOLN
2.0000 g | Freq: Once | INTRAVENOUS | Status: AC
  Administered 2016-11-16: 2 g via INTRAVENOUS
  Filled 2016-11-16: qty 2000

## 2016-11-16 MED ORDER — IBUPROFEN 600 MG PO TABS
600.0000 mg | ORAL_TABLET | Freq: Four times a day (QID) | ORAL | Status: DC
  Administered 2016-11-16 – 2016-11-18 (×10): 600 mg via ORAL
  Filled 2016-11-16 (×9): qty 1

## 2016-11-16 MED ORDER — ONDANSETRON HCL 4 MG/2ML IJ SOLN: 4.0000 mg | INTRAMUSCULAR | Status: DC | PRN

## 2016-11-16 MED ORDER — ACETAMINOPHEN 325 MG PO TABS: 650.0000 mg | ORAL_TABLET | ORAL | Status: DC | PRN

## 2016-11-16 MED ORDER — ONDANSETRON HCL 4 MG PO TABS: 4.0000 mg | ORAL_TABLET | ORAL | Status: DC | PRN

## 2016-11-16 MED ORDER — ONDANSETRON HCL 4 MG/2ML IJ SOLN: 4.0000 mg | Freq: Four times a day (QID) | INTRAMUSCULAR | Status: DC | PRN

## 2016-11-16 MED ORDER — LACTATED RINGERS IV SOLN: 500.0000 mL | INTRAVENOUS | Status: DC | PRN

## 2016-11-16 MED ORDER — WITCH HAZEL-GLYCERIN EX PADS: 1.0000 "application " | MEDICATED_PAD | CUTANEOUS | Status: DC | PRN

## 2016-11-16 MED ORDER — TETANUS-DIPHTH-ACELL PERTUSSIS 5-2.5-18.5 LF-MCG/0.5 IM SUSP: 0.5000 mL | Freq: Once | INTRAMUSCULAR | Status: DC

## 2016-11-16 MED ORDER — FLEET ENEMA 7-19 GM/118ML RE ENEM: 1.0000 | ENEMA | RECTAL | Status: DC | PRN

## 2016-11-16 MED ORDER — MISOPROSTOL 200 MCG PO TABS
ORAL_TABLET | ORAL | Status: AC
  Filled 2016-11-16: qty 4

## 2016-11-16 MED ORDER — MISOPROSTOL 200 MCG PO TABS
800.0000 ug | ORAL_TABLET | Freq: Once | ORAL | Status: AC
  Administered 2016-11-16: 800 ug via RECTAL

## 2016-11-16 MED ORDER — BENZOCAINE-MENTHOL 20-0.5 % EX AERO
1.0000 "application " | INHALATION_SPRAY | CUTANEOUS | Status: DC | PRN
  Administered 2016-11-16: 1 via TOPICAL
  Filled 2016-11-16 (×2): qty 56

## 2016-11-16 MED ORDER — COCONUT OIL OIL: 1.0000 "application " | TOPICAL_OIL | Status: DC | PRN

## 2016-11-16 MED ORDER — LIDOCAINE HCL (PF) 1 % IJ SOLN
30.0000 mL | INTRAMUSCULAR | Status: DC | PRN
  Filled 2016-11-16: qty 30

## 2016-11-16 MED ORDER — LACTATED RINGERS IV SOLN
INTRAVENOUS | Status: DC
  Administered 2016-11-16: 05:00:00 via INTRAVENOUS

## 2016-11-16 MED ORDER — OXYCODONE-ACETAMINOPHEN 5-325 MG PO TABS: 1.0000 | ORAL_TABLET | ORAL | Status: DC | PRN

## 2016-11-16 MED ORDER — OXYTOCIN 40 UNITS IN LACTATED RINGERS INFUSION - SIMPLE MED
2.5000 [IU]/h | INTRAVENOUS | Status: DC
  Administered 2016-11-16: 2.5 [IU]/h via INTRAVENOUS
  Filled 2016-11-16: qty 1000

## 2016-11-16 MED ORDER — SIMETHICONE 80 MG PO CHEW: 80.0000 mg | CHEWABLE_TABLET | ORAL | Status: DC | PRN

## 2016-11-16 MED ORDER — DIBUCAINE 1 % RE OINT: 1.0000 "application " | TOPICAL_OINTMENT | RECTAL | Status: DC | PRN

## 2016-11-16 NOTE — MAU Note (Signed)
CTX every 5-8 minutes since 0030.  No LOF/VB.  Reports good fetal movement.  Cervix has not yet been checked.

## 2016-11-16 NOTE — Progress Notes (Signed)
Patient wishes for her husband to order her meals also patient states that she will call an Interpreter if she feels the need. Eda h Development worker, international aid.

## 2016-11-16 NOTE — H&P (Signed)
OBSTETRIC ADMISSION HISTORY AND PHYSICAL  Amy Tyler is a 36 y.o. female [phone] with IUP at [redacted]w[redacted]d by LMP presenting for active labor. She reports +FMs, No LOF, no VB, no blurry vision, headaches or peripheral edema, and RUQ pain.  She plans on breast and bottle feeding. She request Nexplanon for birth control. She received her prenatal care at [facility].  Dating: By LMP --->  Estimated Date of Delivery: 11/22/16  Sono:   07/11/16: 20+6, normal anatomy, cephalic   Prenatal History/Complications:  Past Medical History: Past Medical History:  Diagnosis Date  . Anxiety   . Depression     Past Surgical History: No past surgical history on file.  Obstetrical History: OB History    Gravida Para Term Preterm AB Living   0 0 3   SAB TAB Ectopic Multiple Live Births                  Obstetric Comments   No GDM or GHTN with prior pregnancies.       Social History: Social History   Social History  . Marital status: Married    Spouse name: N/A  . Number of children: N/A  . Years of education: N/A   Social History Main Topics  . Smoking status: Never Smoker  . Smokeless tobacco: Never Used  . Alcohol use No  . Drug use: No  . Sexual activity: Yes    Birth control/ protection: Condom   Other Topics Concern  . None   Social History Narrative  . None    Family History: Family History  Problem Relation Age of Onset  . Diabetes Mother   . Tuberous sclerosis Daughter     Allergies: No Known Allergies  Prescriptions Prior to Admission  Medication Sig Dispense Refill Last Dose  . Prenatal Multivit-Min-Fe-FA (PRENATAL VITAMINS PO) Take 1 tablet by mouth daily.   11/16/2016 at Unknown time     Review of Systems   All systems reviewed and negative except as stated in HPI  Blood pressure 127/61, pulse 77, temperature 98 F (36.7 C), temperature source Oral, resp. rate 19, last menstrual period 02/16/2016. General appearance: alert, cooperative  and no distress Lungs: clear to auscultation bilaterally Heart: regular rate and rhythm Abdomen: soft, non-tender; bowel sounds normal Pelvic: adequate Extremities: Homans sign is negative, no sign of DVT Presentation: cephalic Dilation: 7 Effacement (%): 90 Station: -2, -1 Exam by:: Anna Cioce RN   FHR 130, mod, +accels, no decels, toco q3-5   Prenatal labs: ABO, Rh: O/Positive/-- (03/28 1014) Antibody: Negative (03/28 1014) Rubella: 9.05 (03/28 1014) RPR: Non Reactive (07/05 1026)  HBsAg: Negative (03/28 1014)  HIV:   Non-reactive GBS: Positive (09/11 0000)  1 hr Glucola normal Genetic screening  normal Anatomy US normal  Prenatal Transfer Tool  Maternal Diabetes: No Genetic Screening: Normal Maternal Ultrasounds/Referrals: Normal Fetal Ultrasounds or other Referrals:  None Maternal Substance Abuse:  No Significant Maternal Medications:  None Significant Maternal Lab Results: Lab values include: Group B Strep positive  No results found for this or any previous visit (from the past 24 hour(s)).  Patient Active Problem List   Diagnosis Date Noted  . Positive GBS test 11/14/2016  . Vivid dream 08/31/2016  . Third trimester pregnancy   . Hereditary disease in family possibly affecting fetus 07/13/2016  . Insomnia 10/27/2015  . Anxiety and depression 03/04/2013    Assessment/Plan:  Amy Tyler is a 36 y.o. G4P3003 at [redacted]w[redacted]d here for SOL.  #  Labor: progressed quickly from 5>7 in MAU; anticipate SVD soon #AMA #Family history tuberous sclerosis #Anxiety and depression: appropriate #Pain: Desires epidural, but understands she may not have time #FWB: Cat 1 #ID:  GBS positive - ampicillin #MOF: breast and bottle #MOC: Nexplanon #Circ:  declines  Burnard Leigh, MD  11/16/2016, 5:00 AM  CNM attestation:  I have seen and examined this patient; I agree with above documentation in the resident's note.   Amy Tyler is a 36 y.o. [phone] here for  SOL  PE: BP (!) 122/57   Pulse 79   Temp 98.6 F (37 C) (Oral)   Resp 18   LMP 02/16/2016 (Exact Date)   Resp: normal effort, no distress Abd: gravid  ROS, labs, PMH reviewed  Plan: Admit to [facility] Expectant management Amp for GBS ppx Anticipate SVD  SHAW, KIMBERLY CNM 11/16/2016, 6:13 AM

## 2016-11-17 NOTE — Progress Notes (Signed)
MOB was referred for history of depression/anxiety. * Referral screened out by Clinical Social Worker because none of the following criteria appear to apply: ~ History of anxiety/depression during this pregnancy, or of post-partum depression. ~ Diagnosis of anxiety and/or depression within last 3 years-onset 2014. OR * MOB's symptoms currently being treated with medication and/or therapy. Please contact the Clinical Social Worker if needs arise, by MOB request, or if MOB scores greater than 9/yes to question 10 on Edinburgh Postpartum Depression Screen.  It is documented on 05/31/16 that MOB denies symptoms of Anxiety and Depression.  Note on 11/14/16 states, "denies any symptoms of anxiety or depression during pregnancy. Has been in good mood. No excessive worries or sadness."  

## 2016-11-17 NOTE — Lactation Note (Signed)
This note was copied from a baby's chart. Lactation Consultation Note Mom speaks some English, told mom LC needed to do some education about BF, did she need an interpreter, mom stated yes.. LC had the Dexter incase needed. Interpreter # [phone] Porfirio Mylar assisted in consult interpretation. Mom's 4th baby. Mom BF her 38, 14,8, yr old for 6 months. Denied BF issues or infections.  Mom stated the baby was BF a lot and was hungry. Mom stated she wanted to give formula also w/BF. Mom stated that she breast and formula fed her other children as well. Plans to mostly BF but was going to give formula after BF if needed. Mom stated she had good milk supply w/other children.   Moms breast full feeling w/everted nipples. Hand expression w/bare glisten of colostrum. But mom stated baby had been feeding a lot and now she was dry.  Educated newborn feeding habits, STS, I&O, cluster feeding, supply and demand, supplementing. Mom encouraged to feed baby 8-12 times/24 hours and with feeding cues. Encouraged to BF first before giving formula. Supplementing feeding sheet given as well as discussing giving formula.   Reported to RN [facility] brochure given w/resources, support groups and LC services.  Patient Name: Amy Tyler [id number] Date: 11/17/2016 Reason for consult: Initial assessment   Maternal Data Has patient been taught Hand Expression?: Yes Does the patient have breastfeeding experience prior to this delivery?: Yes  Feeding Feeding Type: Breast Fed Length of feed: 20 min  LATCH Score Latch: Grasps breast easily, tongue down, lips flanged, rhythmical sucking.  Audible Swallowing: A few with stimulation  Type of Nipple: Everted at rest and after stimulation  Comfort (Breast/Nipple): Soft / non-tender  Hold (Positioning): No assistance needed to correctly position infant at breast.  LATCH Score: 9  Interventions Interventions: Breast feeding basics reviewed;Position options;Breast  massage;Hand express;Breast compression;Adjust position;Support pillows  Lactation Tools Discussed/Used     Consult Status Consult Status: Follow-up Date: 11/18/16 Follow-up type: In-patient    Charyl Dancer 11/17/2016, 12:59 AM

## 2016-11-17 NOTE — Progress Notes (Signed)
Post Partum Day 1  Subjective:  Amy Tyler is a 36 y.o. [id number] [redacted]w[redacted]d s/p NSVD.  No acute events overnight.  Pt denies problems with ambulating, voiding or po intake.  She denies nausea or vomiting.  Pain is well controlled.  She has had flatus. She has not had bowel movement.  Lochia Small.  Plan for birth control is Nexplanon.  Method of Feeding: breast  Objective: BP (!) 107/53 (BP Location: Right Arm)   Pulse 68   Temp 98 F (36.7 C) (Oral)   Resp 18   LMP 02/16/2016 (Exact Date)   Breastfeeding? Unknown   Physical Exam:  General: alert, cooperative and no distress Lochia:normal flow Chest: no increased work of breathing Abdomen: soft, nontender, fundus firm at/below umbilicus Uterine Fundus: firm DVT Evaluation: No evidence of DVT seen on physical exam. Extremities: No edema   Recent Labs  11/16/16 0500  HGB 13.1  HCT 37.8    Assessment/Plan:  ASSESSMENT: Amy Tyler is a 36 y.o. [id number] [redacted]w[redacted]d ppd #1 s/p NSVD doing well.   Plan for discharge tomorrow and Breastfeeding  Will stay because her GBS prophylaxis is inadequate so her baby will need to stay.   LOS: 1 day   Amanda C. Yaquelin Langelier Furbish, MD PGY-1, [facility] 11/17/2016 7:43 AM  I have seen and examined this patient and agree with the management plan.

## 2016-11-18 DIAGNOSIS — O99824 Streptococcus B carrier state complicating childbirth: Secondary | ICD-10-CM

## 2016-11-18 DIAGNOSIS — Z3A39 39 weeks gestation of pregnancy: Secondary | ICD-10-CM

## 2016-11-18 MED ORDER — INFLUENZA VAC SPLIT QUAD 0.5 ML IM SUSY
0.5000 mL | PREFILLED_SYRINGE | INTRAMUSCULAR | Status: AC
  Administered 2016-11-18: 0.5 mL via INTRAMUSCULAR
  Filled 2016-11-18: qty 0.5

## 2016-11-18 MED ORDER — IBUPROFEN 600 MG PO TABS: 600.0000 mg | ORAL_TABLET | Freq: Four times a day (QID) | ORAL | 0 refills | Status: DC

## 2016-11-18 MED ORDER — ACETAMINOPHEN 325 MG PO TABS: 650.0000 mg | ORAL_TABLET | ORAL | 0 refills | Status: DC | PRN

## 2016-11-18 NOTE — Discharge Instructions (Signed)
Please see your primary doctor on 9/26. You will also need a 6 week postpartum visit - call and schedule.

## 2016-11-18 NOTE — Progress Notes (Signed)
RN at bedside for discharge teaching. Patient declines an interpreter for communication. Verbalizes understanding of teaching, paperwork given. No questions or concerns from patient.

## 2016-11-18 NOTE — Discharge Summary (Signed)
OB Discharge Summary     Patient Name: Amy Tyler DOB: 10-03-1980 MRN: [id number]  Date of admission: 11/16/2016 Delivering MD: Burnard Leigh   Date of discharge: 11/18/2016  Admitting diagnosis: 39 WEEKS CTX Intrauterine pregnancy: [redacted]w[redacted]d     Secondary diagnosis:  Principal Problem:   SVD (spontaneous vaginal delivery) Active Problems:   Anxiety and depression   Third trimester pregnancy   Pregnancy  Additional problems: None     Discharge diagnosis: Term Pregnancy Delivered                                                                                                Post partum procedures: None  Augmentation: AROM  Complications: None  Hospital course:  Onset of Labor With Vaginal Delivery     36 y.o. yo [id number] at [redacted]w[redacted]d was admitted in Active Labor on 11/16/2016. Patient had an uncomplicated labor course as follows:  Membrane Rupture Time/Date: 5:42 AM ,11/16/2016   Intrapartum Procedures: Episiotomy: None [1]                                         Lacerations:  None [1]  Patient had a delivery of a Viable infant. 11/16/2016  Information for the patient's newborn:  Oria, Klimas [[id number]]  Delivery Method: Vag-Spont    Pateint had an uncomplicated postpartum course.  She is ambulating, tolerating a regular diet, passing flatus, and urinating well. Patient is discharged home in stable condition on 11/18/16.    #Anxiety/depression: appropriate during hospitalization.  Physical exam  Vitals:   11/16/16 0945 11/16/16 1301 11/17/16 0500 11/17/16 1812  BP: (!) 113/45 (!) 109/53 (!) 107/53 (!) 111/55  Pulse: 68 80 68 81  Resp: Temp: 98.2 F (36.8 C) 98.9 F (37.2 C) 98 F (36.7 C) 98.1 F (36.7 C)  TempSrc: Oral Oral Oral Oral  SpO2:    99%   General: alert, cooperative and no distress Lochia: appropriate Uterine Fundus: firm Incision: N/A DVT Evaluation: No evidence of DVT seen on physical exam. No significant calf/ankle  edema. Labs: Lab Results  Component Value Date   WBC 13.0 (H) 11/16/2016   HGB 13.1 11/16/2016   HCT 37.8 11/16/2016   MCV 86.3 11/16/2016   PLT 214 11/16/2016   CMP Latest Ref Rng & Units 10/06/2015  Glucose 65 - 99 mg/dL 80  BUN 7 - 25 mg/dL 10  Creatinine 1.30 - 8.65 mg/dL 7.84  Sodium 696 - 295 mmol/L 137  Potassium 3.5 - 5.3 mmol/L 3.9  Chloride 98 - 110 mmol/L 103  CO2 20 - 31 mmol/L 26  Calcium 8.6 - 10.2 mg/dL 9.3  Total Protein 6.1 - 8.1 g/dL 7.0  Total Bilirubin 0.2 - 1.2 mg/dL 1.2  Alkaline Phos 33 - 115 U/L 66  AST 10 - 30 U/L 26  ALT 6 - 29 U/L 42(H)    Discharge instruction: per After Visit Summary and "Baby and Me Booklet".  After visit meds:  Allergies as of  11/18/2016   No Known Allergies     Medication List    TAKE these medications   acetaminophen 325 MG tablet Commonly known as:  TYLENOL Take 2 tablets (650 mg total) by mouth every 4 (four) hours as needed (for pain scale < 4).   ibuprofen 600 MG tablet Commonly known as:  ADVIL,MOTRIN Take 1 tablet (600 mg total) by mouth every 6 (six) hours.   PRENATAL VITAMINS PO Take 1 tablet by mouth daily.            Discharge Care Instructions        Start     Ordered   11/18/16 0000  acetaminophen (TYLENOL) 325 MG tablet  Every 4 hours PRN     11/18/16 0722   11/18/16 0000  ibuprofen (ADVIL,MOTRIN) 600 MG tablet  Every 6 hours     11/18/16 0722   11/18/16 0000  Increase activity slowly     11/18/16 0722   11/18/16 0000  Diet - low sodium heart healthy     11/18/16 0722   11/16/16 0000  OB RESULT CONSOLE Group B Strep    Comments:  This external order was created through the Results Console.   11/16/16 0459   11/16/16 0000  OB RESULTS CONSOLE GC/Chlamydia    Comments:  This external order was created through the Results Console.   11/16/16 0459      Diet: routine diet  Activity: Advance as tolerated. Pelvic rest for 6 weeks.   Outpatient follow up: 6 weeks Follow up Appt: Future  Appointments Date Time Provider Department Center  11/22/2016 9:10 AM Berton Bon, MD [facility]   Follow up Visit:No Follow-up on file.  Postpartum contraception: Nexplanon  Newborn Data: Live born female  Birth Weight: 7 lb 6.5 oz (3359 g) APGAR: 9, 9  Baby Feeding: Bottle and Breast Disposition: home with mother   11/18/2016 Burnard Leigh, MD  CNM attestation I have seen and examined this patient and agree with above documentation in the resident's note.   Amy Tyler is a 36 y.o. [id number] s/p SVD.   Pain is well controlled.  Plan for birth control is Nexplanon.  Method of Feeding: both  PE:  BP (!) 111/55 (BP Location: Right Arm)   Pulse 81   Temp 98.1 F (36.7 C) (Oral)   Resp 18   LMP 02/16/2016 (Exact Date)   SpO2 99%   Breastfeeding? Unknown  Fundus firm  No results for input(s): HGB, HCT in the last 72 hours.   Plan: discharge today - postpartum care discussed - f/u clinic in 4 weeks for postpartum visit   Otha Monical, CNM 3:59 PM

## 2016-11-20 MED FILL — IBUPROFEN 600 MG TABS: 600 | 8 days supply | Qty: 30 | Fill #0

## 2016-11-22 ENCOUNTER — Encounter: Payer: Self-pay | Admitting: Internal Medicine

## 2016-12-27 ENCOUNTER — Ambulatory Visit: Payer: Self-pay | Admitting: Family Medicine

## 2018-03-27 ENCOUNTER — Emergency Department (HOSPITAL_COMMUNITY)
Admission: EM | Admit: 2018-03-27 | Discharge: 2018-03-27 | Disposition: A | Discharge status: Home / Self Care | Payer: Self-pay | Attending: Emergency Medicine | Admitting: Emergency Medicine

## 2018-03-27 ENCOUNTER — Emergency Department (HOSPITAL_COMMUNITY): Payer: Self-pay

## 2018-03-27 DIAGNOSIS — K8020 Calculus of gallbladder without cholecystitis without obstruction: Secondary | ICD-10-CM | POA: Insufficient documentation

## 2018-03-27 DIAGNOSIS — R1013 Epigastric pain: Secondary | ICD-10-CM

## 2018-03-27 LAB — URINALYSIS, ROUTINE W REFLEX MICROSCOPIC
Bacteria, UA: NONE SEEN
Bilirubin Urine: NEGATIVE
GLUCOSE, UA: NEGATIVE mg/dL
Ketones, ur: NEGATIVE mg/dL
Leukocytes, UA: NEGATIVE
Nitrite: NEGATIVE
Protein, ur: NEGATIVE mg/dL
Specific Gravity, Urine: 1.017 (ref 1.005–1.030)
pH: 8 (ref 5.0–8.0)

## 2018-03-27 LAB — COMPREHENSIVE METABOLIC PANEL
ALBUMIN: 4.1 g/dL (ref 3.5–5.0)
ALK PHOS: 57 U/L (ref 38–126)
ALT: 32 U/L (ref 0–44)
ANION GAP: 9 (ref 5–15)
AST: 24 U/L (ref 15–41)
BILIRUBIN TOTAL: 0.9 mg/dL (ref 0.3–1.2)
BUN: 17 mg/dL (ref 6–20)
CALCIUM: 8.9 mg/dL (ref 8.9–10.3)
CO2: 25 mmol/L (ref 22–32)
Chloride: 103 mmol/L (ref 98–111)
Creatinine, Ser: 0.55 mg/dL (ref 0.44–1.00)
GFR calc Af Amer: >60 mL/min (ref >60)
GLUCOSE: 136 mg/dL — AB (ref 70–99)
Potassium: 3.6 mmol/L (ref 3.5–5.1)
Sodium: 137 mmol/L (ref 135–145)
TOTAL PROTEIN: 7.5 g/dL (ref 6.5–8.1)

## 2018-03-27 LAB — CBC
HCT: 38.1 % (ref 36.0–46.0)
Hemoglobin: 12.7 g/dL (ref 12.0–15.0)
MCH: 29.5 pg (ref 26.0–34.0)
MCHC: 33.3 g/dL (ref 30.0–36.0)
MCV: 88.4 fL (ref 80.0–100.0)
PLATELETS: 319 10*3/uL (ref 150–400)
RBC: 4.31 MIL/uL (ref 3.87–5.11)
RDW: 12.5 % (ref 11.5–15.5)
WBC: 15.1 10*3/uL — ABNORMAL HIGH (ref 4.0–10.5)
nRBC: 0 % (ref 0.0–0.2)

## 2018-03-27 LAB — LIPASE, BLOOD: Lipase: 31 U/L (ref 11–51)

## 2018-03-27 LAB — I-STAT BETA HCG BLOOD, ED (MC, WL, AP ONLY): I-stat hCG, quantitative: <5 m[IU]/mL (ref <5)

## 2018-03-27 MED ORDER — ALUM & MAG HYDROXIDE-SIMETH 200-200-20 MG/5ML PO SUSP
30.0000 mL | Freq: Once | ORAL | Status: AC
  Administered 2018-03-27: 30 mL via ORAL
  Filled 2018-03-27: qty 30

## 2018-03-27 MED ORDER — HYDROCODONE-ACETAMINOPHEN 5-325 MG PO TABS: 1.0000 | ORAL_TABLET | Freq: Four times a day (QID) | ORAL | 0 refills | Status: DC | PRN

## 2018-03-27 MED ORDER — KETOROLAC TROMETHAMINE 15 MG/ML IJ SOLN: 15.0000 mg | Freq: Once | INTRAMUSCULAR | Status: DC

## 2018-03-27 MED ORDER — SODIUM CHLORIDE 0.9% FLUSH: 3.0000 mL | Freq: Once | INTRAVENOUS | Status: DC

## 2018-03-27 MED ORDER — ONDANSETRON 4 MG PO TBDP: 4.0000 mg | ORAL_TABLET | Freq: Three times a day (TID) | ORAL | 0 refills | Status: AC | PRN

## 2018-03-27 MED ORDER — ONDANSETRON 4 MG PO TBDP
4.0000 mg | ORAL_TABLET | Freq: Once | ORAL | Status: AC | PRN
  Administered 2018-03-27: 4 mg via ORAL
  Filled 2018-03-27: qty 1

## 2018-03-27 MED ORDER — MORPHINE SULFATE (PF) 4 MG/ML IV SOLN
4.0000 mg | Freq: Once | INTRAVENOUS | Status: AC
  Administered 2018-03-27: 4 mg via INTRAMUSCULAR
  Filled 2018-03-27: qty 1

## 2018-03-27 MED ORDER — KETOROLAC TROMETHAMINE 60 MG/2ML IM SOLN
60.0000 mg | Freq: Once | INTRAMUSCULAR | Status: AC
  Administered 2018-03-27: 60 mg via INTRAMUSCULAR
  Filled 2018-03-27: qty 2

## 2018-03-27 NOTE — Discharge Instructions (Addendum)
Si desarrolla fiebre con este mismo dolor o no puede tolerar la hidratacin oral, regrese al departamento de emergencias para su evaluacin. Puede tomar ibuprofen para el dolor, 600 mg cada 6-8 horas.

## 2018-03-27 NOTE — ED Notes (Signed)
Patient provided ginger ale for po challenge

## 2018-03-27 NOTE — ED Provider Notes (Signed)
[facility] EMERGENCY DEPARTMENT Provider Note  CSN: [id number] Arrival date & time: 03/27/18 0202  Chief Complaint(s) Emesis  HPI Amy Tyler is a 38 y.o. female who presents to the emergency department with epigastric abdominal pain described as stabbing, constant without alleviating or aggravating factors.  She reports that the pain began approximately 5 hours prior to arrival.  This was after eating shrimp cocktail and oatmeal.  She reports that other family members ate the same without having any issues.  She endorses associated nausea with several episodes of nonbloody nonbilious emesis.  She denies any diarrhea.  No chest pain or shortness of breath.  HPI  Past Medical History Past Medical History:  Diagnosis Date  . Anxiety   . Depression    Patient Active Problem List   Diagnosis Date Noted  . Pregnancy 11/16/2016  . SVD (spontaneous vaginal delivery) 11/16/2016  . Positive GBS test 11/14/2016  . Vivid dream 08/31/2016  . Third trimester pregnancy   . Hereditary disease in family possibly affecting fetus 07/13/2016  . Insomnia 10/27/2015  . Anxiety and depression 03/04/2013   Home Medication(s) Prior to Admission medications   Medication Sig Start Date End Date Taking? Authorizing Provider  acetaminophen (TYLENOL) 325 MG tablet Take 2 tablets (650 mg total) by mouth every 4 (four) hours as needed (for pain scale < 4). Patient not taking: Reported on 03/27/2018 11/18/16   Burnard Leigh, MD  ibuprofen (ADVIL,MOTRIN) 600 MG tablet Take 1 tablet (600 mg total) by mouth every 6 (six) hours. Patient not taking: Reported on 03/27/2018 11/18/16   Burnard Leigh, MD                                                                                                                                    Past Surgical History No past surgical history on file. Family History Family History  Problem Relation Age of Onset  . Diabetes Mother   . Tuberous sclerosis  Daughter     Social History Social History   Tobacco Use  . Smoking status: Never Smoker  . Smokeless tobacco: Never Used  Substance Use Topics  . Alcohol use: No  . Drug use: No   Allergies Patient has no known allergies.  Review of Systems Review of Systems All other systems are reviewed and are negative for acute change except as noted in the HPI  Physical Exam Vital Signs  I have reviewed the triage vital signs BP 120/62 (BP Location: Right Arm)   Pulse 61   Temp 97.9 F (36.6 C) (Oral)   Resp 19   SpO2 100%   LMP: yesterday  Physical Exam Vitals signs reviewed.  Constitutional:      General: She is not in acute distress.    Appearance: She is well-developed. She is not diaphoretic.  HENT:     Head: Normocephalic and atraumatic.     Right Ear: External ear normal.  Left Ear: External ear normal.     Nose: Nose normal.  Eyes:     General: No scleral icterus.    Conjunctiva/sclera: Conjunctivae normal.  Neck:     Musculoskeletal: Normal range of motion.     Trachea: Phonation normal.  Cardiovascular:     Rate and Rhythm: Normal rate and regular rhythm.  Pulmonary:     Effort: Pulmonary effort is normal. No respiratory distress.     Breath sounds: No stridor.  Abdominal:     General: There is no distension.     Tenderness: There is abdominal tenderness in the epigastric area. There is no guarding or rebound. Negative signs include Murphy's sign.  Musculoskeletal: Normal range of motion.  Neurological:     Mental Status: She is alert and oriented to person, place, and time.  Psychiatric:        Behavior: Behavior normal.     ED Results and Treatments Labs (all labs ordered are listed, but only abnormal results are displayed) Labs Reviewed  COMPREHENSIVE METABOLIC PANEL - Abnormal; Notable for the following components:      Result Value   Glucose, Bld 136 (*)    All other components within normal limits  CBC - Abnormal; Notable for the  following components:   WBC 15.1 (*)    All other components within normal limits  URINALYSIS, ROUTINE W REFLEX MICROSCOPIC - Abnormal; Notable for the following components:   Hgb urine dipstick MODERATE (*)    RBC / HPF >50 (*)    All other components within normal limits  LIPASE, BLOOD  I-STAT BETA HCG BLOOD, ED ([facility], [facility], [facility] ONLY)                                                                                                                         EKG  EKG Interpretation  Date/Time:    Ventricular Rate:    PR Interval:    QRS Duration:   QT Interval:    QTC Calculation:   R Axis:     Text Interpretation:        Radiology No results found. Pertinent labs & imaging results that were available during my care of the patient were reviewed by me and considered in my medical decision making (see chart for details).  Medications Ordered in ED Medications  sodium chloride flush (NS) 0.9 % injection 3 mL (has no administration in time range)  morphine 4 MG/ML injection 4 mg (has no administration in time range)  ondansetron (ZOFRAN-ODT) disintegrating tablet 4 mg (4 mg Oral Given 03/27/18 0251)  alum & mag hydroxide-simeth (MAALOX/MYLANTA) 200-200-20 MG/5ML suspension 30 mL (30 mLs Oral Given 03/27/18 0524)  Procedures Procedures  (including critical care time)  Medical Decision Making / ED Course I have reviewed the nursing notes for this encounter and the patient's prior records (if available in EHR or on provided paperwork).    Patient presents with epigastric abdominal pain in the setting of suspicious food intake.  Epigastric tenderness to palpation without evidence of peritonitis.  Labs with leukocytosis.  No anemia.  No significant electrolyte derangements or renal insufficiency.  No evidence of biliary obstruction or pancreatitis.  Beta hCG  negative.  UA with hematuria but without evidence of infection.  After GI cocktail patient is still having pain.  Will obtain right upper quadrant ultrasound to rule out acute cholecystitis.  Patient care turned over to Dr Erma HeritageIsaacs at [phone]. Patient case and results discussed in detail; please see their note for further ED managment.      Final Clinical Impression(s) / ED Diagnoses Final diagnoses:  Epigastric abdominal pain      This chart was dictated using voice recognition software.  Despite best efforts to proofread,  errors can occur which can change the documentation meaning.   Nira Connardama, Pedro Eduardo, MD 03/27/18 [phone]

## 2018-03-27 NOTE — ED Provider Notes (Signed)
Assumed care from Dr. Eudelia Bunch at 10:01 AM. Briefly, the patient is a 38 y.o. female with PMHx of  has a past medical history of Anxiety and Depression. here with RUQ pain.  Suspect symptomatic gallstones.  Right upper quadrant ultrasound obtained and shows multiple stones.  She feels better with analgesia.  Plan to p.o. challenge and discharged with outpatient surgery follow-up if feeling better.  Labs Reviewed  COMPREHENSIVE METABOLIC PANEL - Abnormal; Notable for the following components:      Result Value   Glucose, Bld 136 (*)    All other components within normal limits  CBC - Abnormal; Notable for the following components:   WBC 15.1 (*)    All other components within normal limits  URINALYSIS, ROUTINE W REFLEX MICROSCOPIC - Abnormal; Notable for the following components:   Hgb urine dipstick MODERATE (*)    RBC / HPF >50 (*)    All other components within normal limits  LIPASE, BLOOD  I-STAT BETA HCG BLOOD, ED ([facility], [facility], [facility] ONLY)    Course of Care: Patient feels much better after analgesia.  Pain is now 3-4 out of 10.  She is been able to drink without difficulty.  I discussed management options with her in the presence of an interpreter.  Given her improvement in pain, she would like to attempt outpatient management.  She has mild leukocytosis but normal LFTs and no evidence of acute obstructive cholangitis, cholecystitis, or other complication.  Will advise low-fat diet, refer to surgery, and give brief course of analgesics.     Shaune Pollack, MD 03/27/18 1001

## 2018-03-27 NOTE — ED Notes (Signed)
Patient verbalized understanding of dc instructions, Rx and follow up. Vss, ambulatory with nad upon discharge.

## 2018-03-27 NOTE — ED Notes (Signed)
ED Provider at bedside. 

## 2018-03-27 NOTE — ED Triage Notes (Signed)
Patient states that she began vomiting last night at 10pm.

## 2018-03-27 NOTE — ED Notes (Signed)
Patient transported to Ultrasound 

## 2018-04-10 ENCOUNTER — Ambulatory Visit: Payer: Self-pay | Admitting: Family Medicine

## 2018-05-22 ENCOUNTER — Ambulatory Visit: Payer: Self-pay | Admitting: Family Medicine

## 2018-06-24 ENCOUNTER — Ambulatory Visit: Payer: Self-pay | Admitting: Family Medicine

## 2018-06-24 ENCOUNTER — Encounter: Payer: Self-pay | Admitting: Family Medicine

## 2018-06-24 ENCOUNTER — Other Ambulatory Visit: Payer: Self-pay

## 2018-06-24 VITALS — BP 112/69 | HR 87 | Temp 98.4°F | Ht 59.84 in | Wt 130.0 lb

## 2018-06-24 DIAGNOSIS — F5104 Psychophysiologic insomnia: Secondary | ICD-10-CM

## 2018-06-24 DIAGNOSIS — F419 Anxiety disorder, unspecified: Secondary | ICD-10-CM

## 2018-06-24 DIAGNOSIS — R6889 Other general symptoms and signs: Secondary | ICD-10-CM

## 2018-06-24 DIAGNOSIS — F329 Major depressive disorder, single episode, unspecified: Secondary | ICD-10-CM

## 2018-06-24 DIAGNOSIS — N6459 Other signs and symptoms in breast: Secondary | ICD-10-CM

## 2018-06-24 MED ORDER — TRAZODONE HCL 50 MG PO TABS: 50.0000 mg | ORAL_TABLET | Freq: Every evening | ORAL | 3 refills | Status: DC | PRN

## 2018-06-24 NOTE — Progress Notes (Signed)
4/27/20203:30 PM  Amy Tyler 10/02/1980, 38 y.o., female [id number]  Chief Complaint  Patient presents with  . Insomnia    this has been going on a few months due to stress. Says she is not depressed or has anxiety    HPI:   Patient is a 38 y.o. female with past medical history significant for depression and anxiety who presents today for insomnia  6 years started struggling with depression and anxiety Anxiety has always been worse Used to take medication, she felt it did not work She started to work on coping mechanism, self -help Feels that her mind continues to work and worry and not able to sleep Has very vivid dreams Worsening for past 4 months Married No close family Has done [facility]  Also worried about clustering a small bumps around areola, right breast, no puckering of skin, no nipple discharge, no swollen nodes  Both her daughters have hyperthyroidism One of her daughters has sclerosis Omantuberosa and autismo Med review buspar 10mg , sertraline 50mg , trazadone 100mg   GAD 7 : Generalized Anxiety Score 06/24/2018 12/08/2015 10/06/2015  Nervous, Anxious, on Edge 2 2 2   Control/stop worrying 1 2 3   Worry too much - different things 1 1 1   Trouble relaxing 1 3 2   Restless 1 2 2   Easily annoyed or irritable 0 1 1  Afraid - awful might happen 0 0 -  Total GAD 7 Score 6 11 -     Fall Risk  06/24/2018 10/19/2016 07/11/2016 10/27/2015 07/20/2015  Falls in the past year? 0 No No No No  Number falls in past yr: 0 - - - -  Injury with Fall? 0 - - - -     Depression screen [facility] 2/9 06/24/2018 06/24/2018 06/24/2018  Decreased Interest 1 1 0  Down, Depressed, Hopeless 0 0 0  PHQ - 2 Score 1 1 0  Altered sleeping 3 - -  Tired, decreased energy 0 - -  Change in appetite 1 - -  Feeling bad or failure about yourself  1 - -  Trouble concentrating 0 - -  Moving slowly or fidgety/restless 0 - -  Suicidal thoughts - - -  PHQ-9 Score 6 -  -  Difficult doing work/chores Not difficult at all - -    No Known Allergies  Prior to Admission medications   Not on File    Past Medical History:  Diagnosis Date  . Anxiety   . Depression     History reviewed. No pertinent surgical history.  Social History   Tobacco Use  . Smoking status: Never Smoker  . Smokeless tobacco: Never Used  Substance Use Topics  . Alcohol use: No    Family History  Problem Relation Age of Onset  . Diabetes Mother   . Tuberous sclerosis Daughter     ROS Per hpi  OBJECTIVE:  Today's Vitals   06/24/18 1505  BP: 112/69  Pulse: 87  Temp: 98.4 F (36.9 C)  TempSrc: Oral  SpO2: 98%  Weight: 130 lb (59 kg)  Height: 4' 11.84" (1.52 m)   Body mass index is 25.52 kg/m.   Physical Exam Vitals signs and nursing note reviewed. Exam conducted with a chaperone present.  Constitutional:      Appearance: She is well-developed.  HENT:     Head: Normocephalic and atraumatic.     Right Ear: Hearing, tympanic membrane, ear canal and external ear normal.     Left Ear: Hearing,  tympanic membrane, ear canal and external ear normal.     Mouth/Throat:     Mouth: Mucous membranes are moist.     Pharynx: No oropharyngeal exudate or posterior oropharyngeal erythema.  Eyes:     Extraocular Movements: Extraocular movements intact.     Conjunctiva/sclera: Conjunctivae normal.     Pupils: Pupils are equal, round, and reactive to light.  Neck:     Musculoskeletal: Neck supple.     Thyroid: No thyromegaly.  Cardiovascular:     Rate and Rhythm: Normal rate and regular rhythm.     Heart sounds: Normal heart sounds. No murmur. No friction rub. No gallop.   Pulmonary:     Effort: Pulmonary effort is normal.     Breath sounds: Normal breath sounds. No wheezing, rhonchi or rales.  Chest:     Breasts: Breasts are symmetrical.        Right: Normal. No mass, nipple discharge or skin change.        Left: Normal. No mass, nipple discharge or skin  change.  Abdominal:     General: Bowel sounds are normal. There is no distension.     Palpations: Abdomen is soft. There is no hepatomegaly, splenomegaly or mass.     Tenderness: There is no abdominal tenderness.  Musculoskeletal: Normal range of motion.     Right lower leg: No edema.     Left lower leg: No edema.  Lymphadenopathy:     Cervical: No cervical adenopathy.     Upper Body:     Right upper body: No supraclavicular, axillary or pectoral adenopathy.     Left upper body: No supraclavicular, axillary or pectoral adenopathy.  Skin:    General: Skin is warm and dry.  Neurological:     Mental Status: She is alert and oriented to person, place, and time.     Cranial Nerves: No cranial nerve deficit.     Gait: Gait normal.     Deep Tendon Reflexes: Reflexes are normal and symmetric.  Psychiatric:        Mood and Affect: Mood normal.        Behavior: Behavior normal.       No results found for this or any previous visit (from the past 24 hour(s)).  No results found.   ASSESSMENT and PLAN  1. Psychophysiological insomnia Trial of trazodone again, reviewed r/se/b  2. Anxiety and depression Patient declines medication at this time. Strongly encouraged to return to counseling.  - TSH; Future - TSH  3. Vivid dream Consider sleep study if not responding to treatment, to eval for parasomnias  4. Breast complaint Normal breast exam. Reassured patient.  Other orders - traZODone (DESYREL) 50 MG tablet; Take 1-2 tablets (50-100 mg total) by mouth at bedtime as needed for sleep.  Return in about 4 weeks (around 07/22/2018).    Myles Lipps, MD Primary Care at [facility] [address] [place], [place] [address] Ph.  [phone] Fax [phone]

## 2018-06-24 NOTE — Patient Instructions (Signed)
° ° ° °  If you have lab work done today you will be contacted with your lab results within the next 2 weeks.  If you have not heard from us then please contact us. The fastest way to get your results is to register for My Chart. ° ° °IF you received an x-ray today, you will receive an invoice from [facility]. Please contact [facility] at [phone] with questions or concerns regarding your invoice.  ° °IF you received labwork today, you will receive an invoice from [facility]. Please contact [facility] at [phone] with questions or concerns regarding your invoice.  ° °Our billing staff will not be able to assist you with questions regarding bills from these companies. ° °You will be contacted with the lab results as soon as they are available. The fastest way to get your results is to activate your My Chart account. Instructions are located on the last page of this paperwork. If you have not heard from us regarding the results in 2 weeks, please contact this office. °  ° ° ° °

## 2018-06-25 LAB — TSH: TSH: 0.991 u[IU]/mL (ref 0.450–4.500)

## 2018-07-01 ENCOUNTER — Encounter: Payer: Self-pay | Admitting: Family Medicine

## 2018-07-26 ENCOUNTER — Other Ambulatory Visit: Payer: Self-pay

## 2018-07-26 ENCOUNTER — Telehealth (INDEPENDENT_AMBULATORY_CARE_PROVIDER_SITE_OTHER): Payer: Self-pay | Admitting: Family Medicine

## 2018-07-26 DIAGNOSIS — F419 Anxiety disorder, unspecified: Secondary | ICD-10-CM

## 2018-07-26 DIAGNOSIS — F32A Depression, unspecified: Secondary | ICD-10-CM

## 2018-07-26 DIAGNOSIS — F5104 Psychophysiologic insomnia: Secondary | ICD-10-CM

## 2018-07-26 DIAGNOSIS — F329 Major depressive disorder, single episode, unspecified: Secondary | ICD-10-CM

## 2018-07-26 DIAGNOSIS — R6889 Other general symptoms and signs: Secondary | ICD-10-CM

## 2018-07-26 MED ORDER — TRAZODONE HCL 50 MG PO TABS: 50.0000 mg | ORAL_TABLET | Freq: Every evening | ORAL | 11 refills | Status: AC | PRN

## 2018-07-26 NOTE — Progress Notes (Signed)
   Virtual Visit Note  I connected with patient on 07/26/18 at 155pm by phone and verified that I am speaking with the correct person using two identifiers. Amy Tyler is currently located at phone and patient is currently with them during visit. The provider, Myles Lipps, MD is located in their office at time of visit.  I discussed the limitations, risks, security and privacy concerns of performing an evaluation and management service by telephone and the availability of in person appointments. I also discussed with the patient that there may be a patient responsible charge related to this service. The patient expressed understanding and agreed to proceed.   CC: followup in insomnia  HPI ? Patient is a 38 y.o. female with past medical history significant for depression and anxiety who presents today for followup on insomnia  Last OV a month ago Started trazadone, taking 100mg  at bedtime Overall sleeping much better, has had only night with vivid dream and was able to wake up from it Feels that anxiety is also less Continues to work on [employer]  Lab Results  Component Value Date   TSH 0.991 06/24/2018    No Known Allergies  Prior to Admission medications   Medication Sig Start Date End Date Taking? Authorizing Provider  traZODone (DESYREL) 50 MG tablet Take 1-2 tablets (50-100 mg total) by mouth at bedtime as needed for sleep. 06/24/18   Myles Lipps, MD    Past Medical History:  Diagnosis Date  . Anxiety   . Depression     No past surgical history on file.  Social History   Tobacco Use  . Smoking status: Never Smoker  . Smokeless tobacco: Never Used  Substance Use Topics  . Alcohol use: No    Family History  Problem Relation Age of Onset  . Diabetes Mother   . Tuberous sclerosis Daughter     ROS Per hpi  Objective  Vitals as reported by the patient: none   ASSESSMENT and PLAN  1. Psychophysiological insomnia 2. Anxiety and depression 3.  Vivid dream Overall doing well. Continue with current regime.   FOLLOW-UP: prn, 1 year   The above assessment and management plan was discussed with the patient. The patient verbalized understanding of and has agreed to the management plan. Patient is aware to call the clinic if symptoms persist or worsen. Patient is aware when to return to the clinic for a follow-up visit. Patient educated on when it is appropriate to go to the emergency department.    I provided 10 minutes of non-face-to-face time during this encounter.  Myles Lipps, MD Primary Care at [facility] [address] [place], [place] [address] Ph.  [phone] Fax [phone]

## 2018-07-26 NOTE — Progress Notes (Signed)
Pt is following up since she has been on the trazodone for sleep. Since she has been on the medication she has been sleeping much better at night and only remembers having vivid dreams twice. She is taking only taking 2 tabs on the nights she feels she needs it, which is not often. She has refills already on file

## 2019-01-24 IMAGING — US US MFM OB DETAIL+14 WK
1 series · 14 of 28 positions shown · non-contrast
Comparison: none

[Series 1: us mfm ob detail+14 wk · 95 acquisitions, 14 frames shown]
[im 4/95]
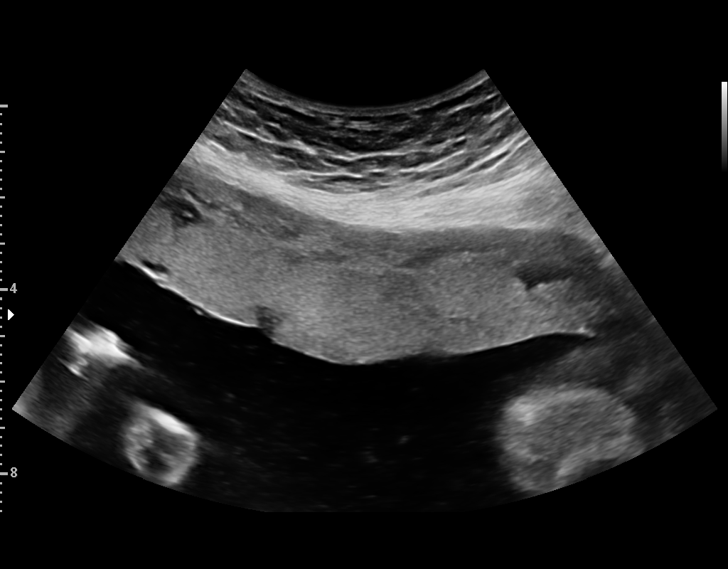
[im 11/95]
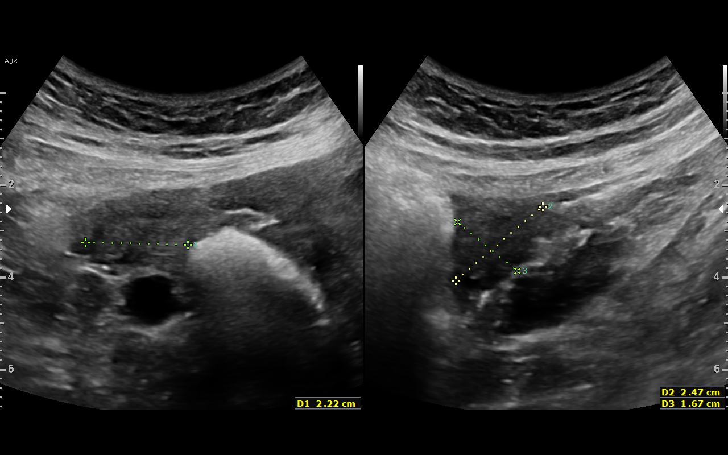
[im 18/95]
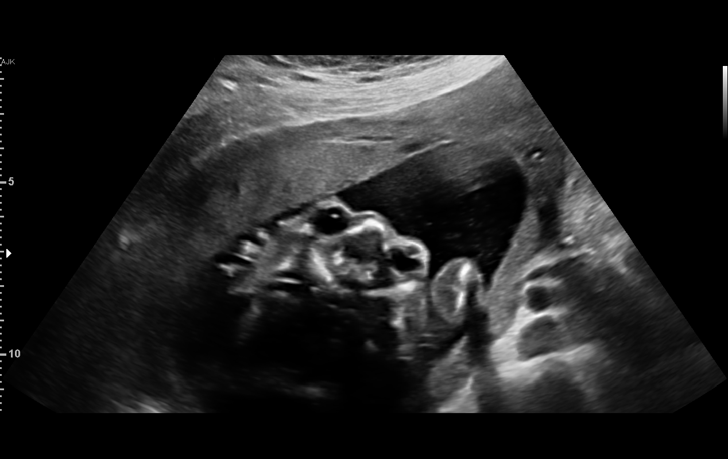
[im 25/95]
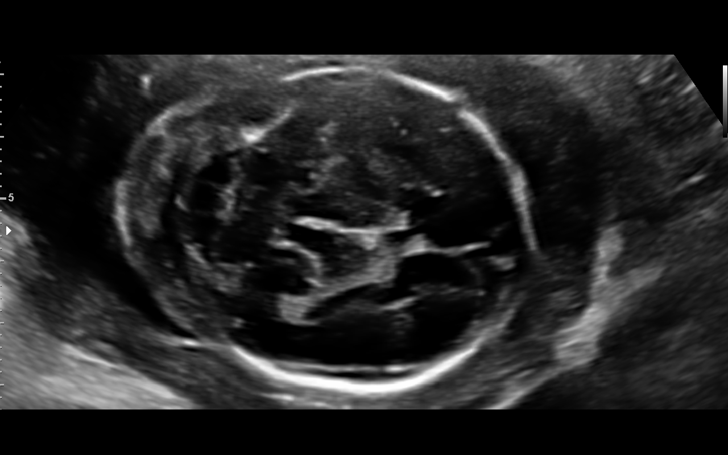
[im 32/95]
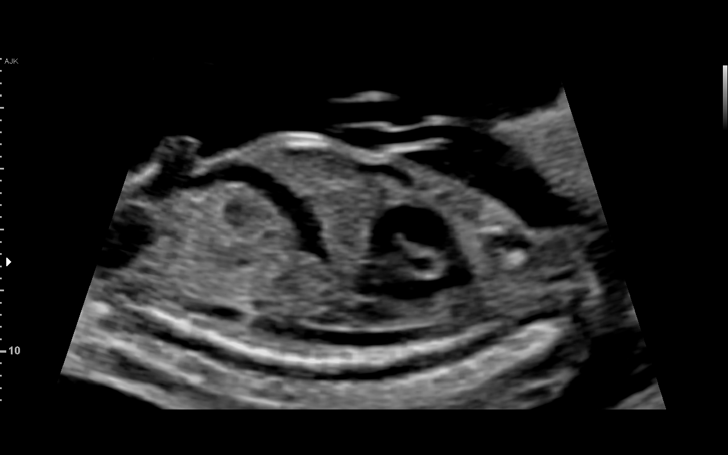
[im 39/95]
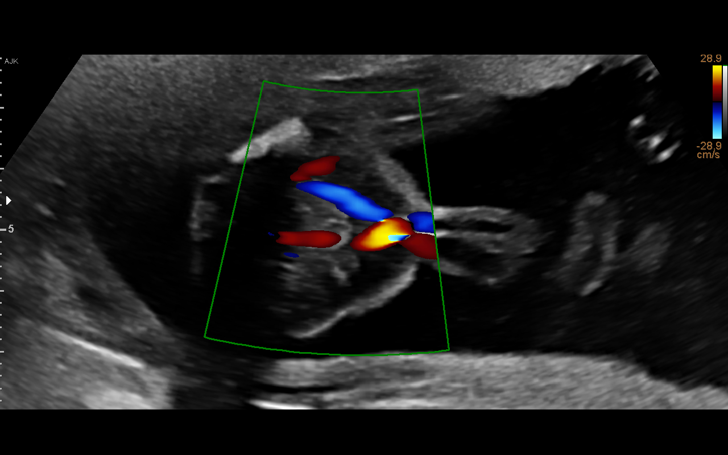
[im 46/95]
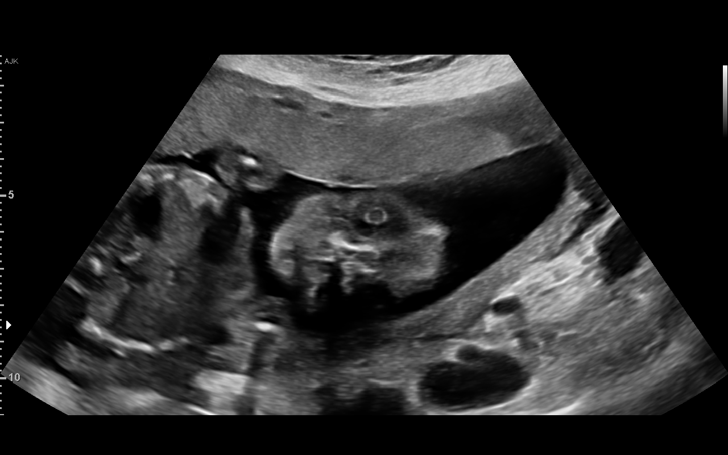
[im 53/95]
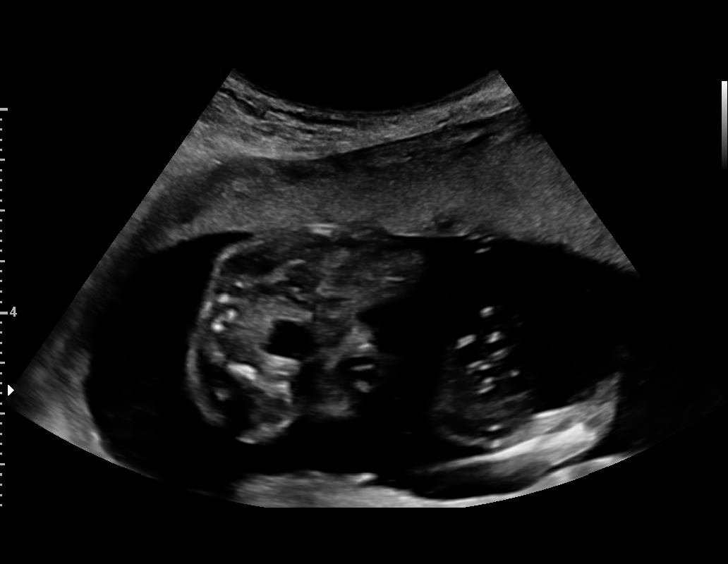
[im 60/95]
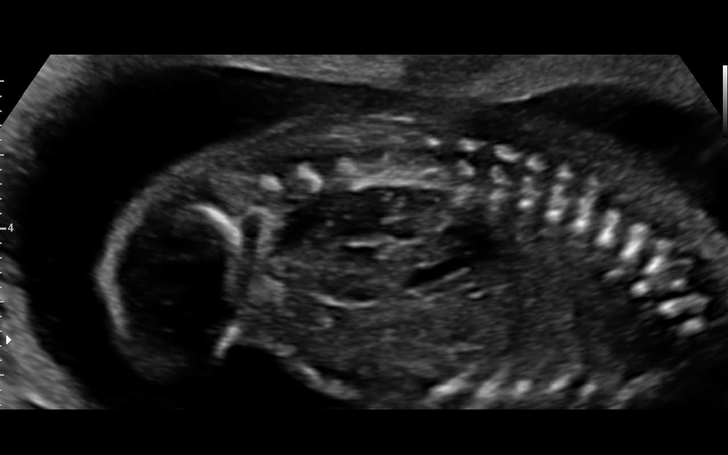
[im 67/95]
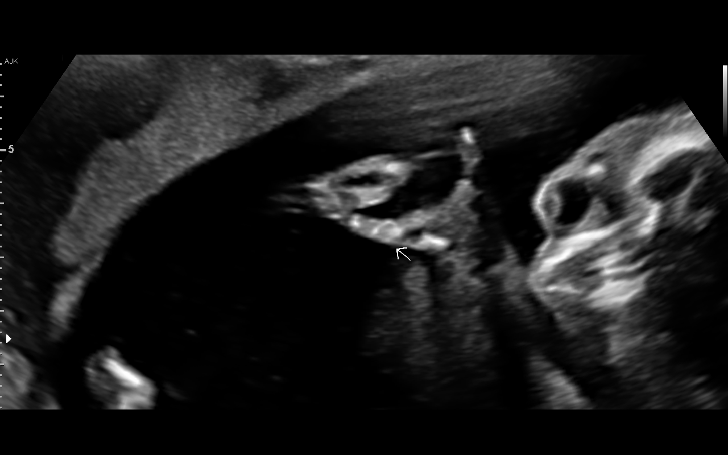
[im 74/95]
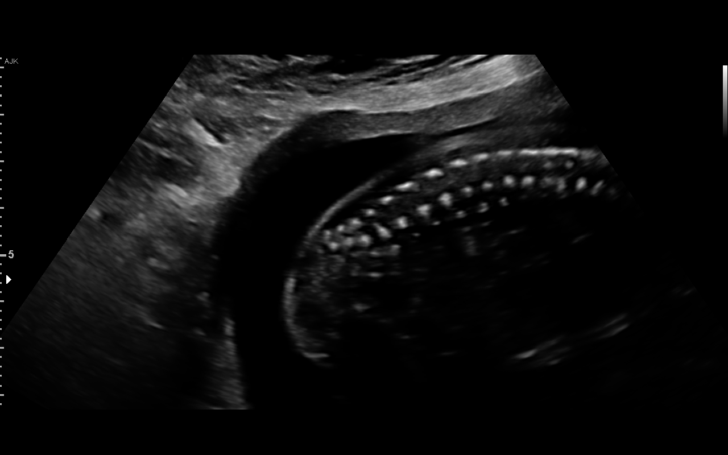
[im 81/95]
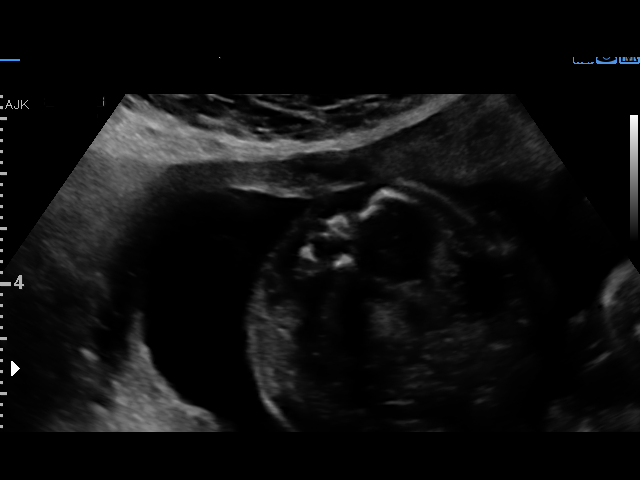
[im 88/95]
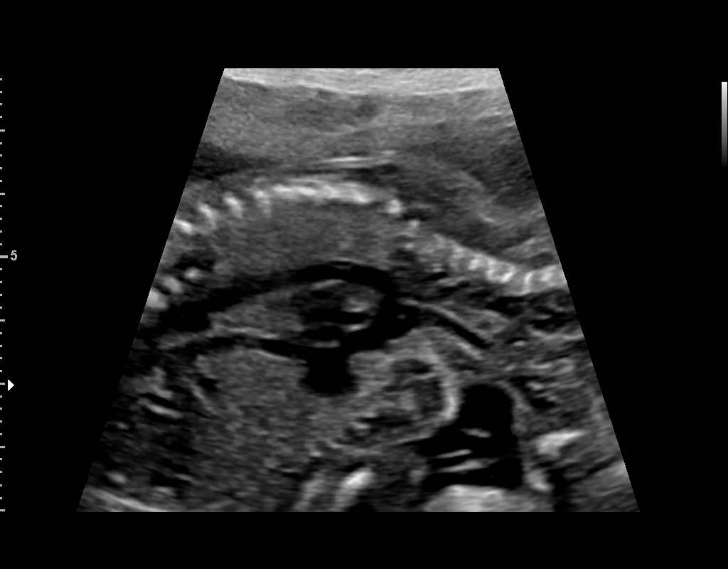
[im 95/95]
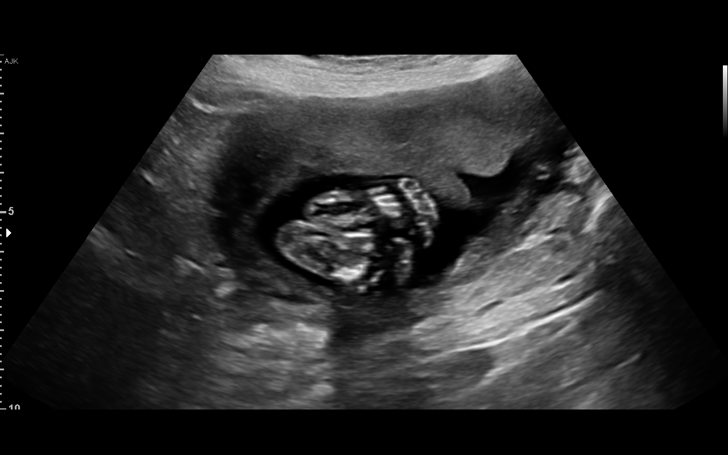

[14 of 28 positions shown; findings below may reference images not displayed]

[facility]
[address]
Sodimu Isu

1  TONDRIN RAPIDEMENT           [id number]      [id number]     [id number]
Indications

20 weeks gestation of pregnancy
Placenta previa specified as without
hemorrhage, second trimester
Family history of genetic disorder (Tuberous
Sclerosis - child)
Advanced maternal age multigravida 35+,
second trimester
Encounter for antenatal screening for
malformations
OB History

Blood Type:            Height:  4'11"  Weight (lb):  142       BMI:
Gravidity:    4         Term:   3        Prem:   0        SAB:   0
TOP:          0       Ectopic:  0        Living: 3
Fetal Evaluation

Num Of Fetuses:     1
Fetal Heart         157
Rate(bpm):
Cardiac Activity:   Observed
Presentation:       Cephalic
Placenta:           Anterior, above cervical os
P. Cord Insertion:  Visualized, central
Amniotic Fluid
AFI FV:      Subjectively within normal limits

Largest Pocket(cm)
5.3
Biometry

BPD:      51.6  mm     G. Age:  21w 5d         79  %    CI:        78.13   %    70 - 86
FL/HC:      16.6   %    15.9 -
HC:      184.7  mm     G. Age:  20w 6d         40  %    HC/AC:      1.10        1.06 -
AC:      168.4  mm     G. Age:  21w 6d         75  %    FL/BPD:     59.5   %
FL:       30.7  mm     G. Age:  19w 4d          8  %    FL/AC:      18.2   %    20 - 24
CER:      20.8  mm     G. Age:  19w 6d         26  %
NFT:       5.4  mm
CM:        3.3  mm

Est. FW:     380  gm    0 lb 13 oz      44  %
Gestational Age

LMP:           20w 6d        Date:  02/16/16                 EDD:   11/22/16
U/S Today:     21w 0d                                        EDD:   11/21/16
Best:          20w 6d     Det. By:  LMP  (02/16/16)          EDD:   11/22/16
Anatomy

Cranium:               Appears normal         Aortic Arch:            Appears normal
Cavum:                 Appears normal         Ductal Arch:            Appears normal
Ventricles:            Appears normal         Diaphragm:              Appears normal
Choroid Plexus:        Appears normal         Stomach:                Appears normal, left
sided
Cerebellum:            Appears normal         Abdomen:                Appears normal
Posterior Fossa:       Appears normal         Abdominal Wall:         Appears nml (cord
insert, abd wall)
Nuchal Fold:           Appears normal         Cord Vessels:           Appears normal (3
vessel cord)
Face:                  Appears normal         Kidneys:                Appear normal
(orbits and profile)
Lips:                  Appears normal         Bladder:                Appears normal
Thoracic:              Appears normal         Spine:                  Appears normal
Heart:                 Appears normal         Upper Extremities:      Appears normal
(4CH, axis, and situs
RVOT:                  Appears normal         Lower Extremities:      Appears normal
LVOT:                  Appears normal

Other:  Fetus appears to be a male. Heels and 5th digit visualized. Nasal
bone visualized.
Cervix Uterus Adnexa

Cervix
Length:           3.54  cm.
Normal appearance by transabdominal scan.

Uterus
No abnormality visualized.

Left Ovary
Within normal limits.
Right Ovary
Within normal limits.

Adnexa:       No abnormality visualized.
Impression

SIUP at 20+6 weeks
Normal detailed fetal anatomy
Markers of aneuploidy: none
Normal amniotic fluid volume
Measurements consistent with LMP dating
Anterior placenta; no previa

After genetic counseling, Ms. Doreta declined
amniocentesis and cell free DNA for aneuploidy and TS.
Recommendations

Follow-up as clinically indicated

## 2020-10-09 IMAGING — US US ABDOMEN LIMITED
1 series · 14 of 25 positions shown · non-contrast
Comparison: None.

CLINICAL DATA: Right upper quadrant pain.

EXAM:
ULTRASOUND ABDOMEN LIMITED RIGHT UPPER QUADRANT

[Series 1: us abdomen limited · 14 of 48 slices shown]
[im 1/48]
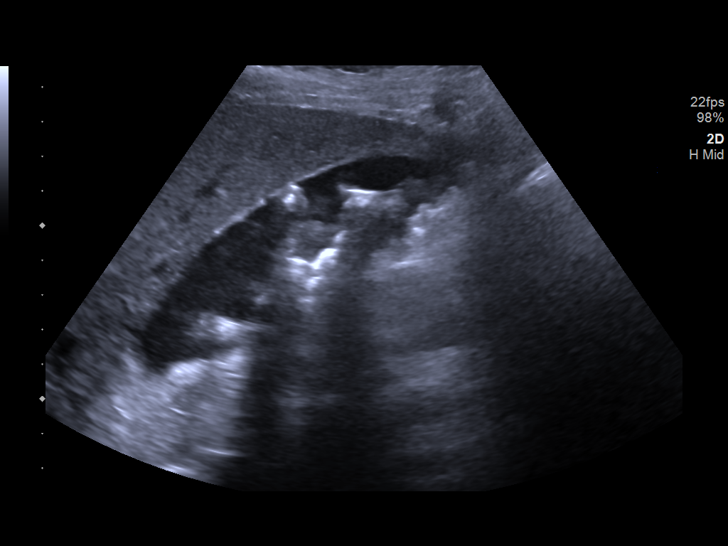
[im 4/48]
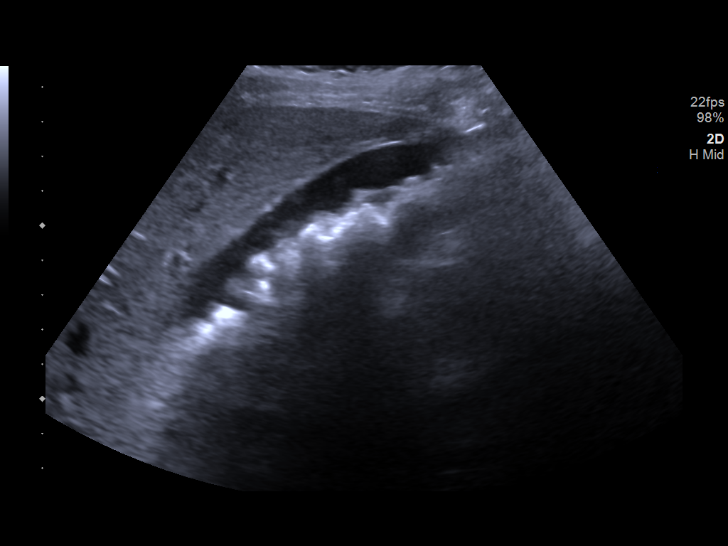
[im 8/48]
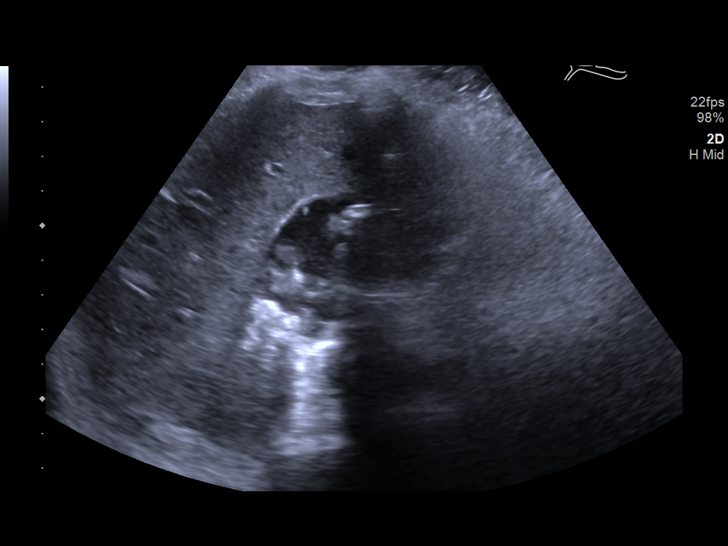
[im 12/48]
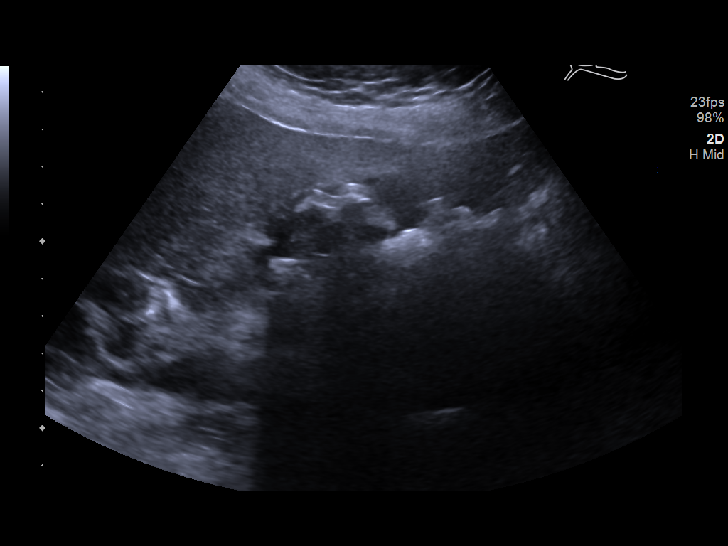
[im 16/48]
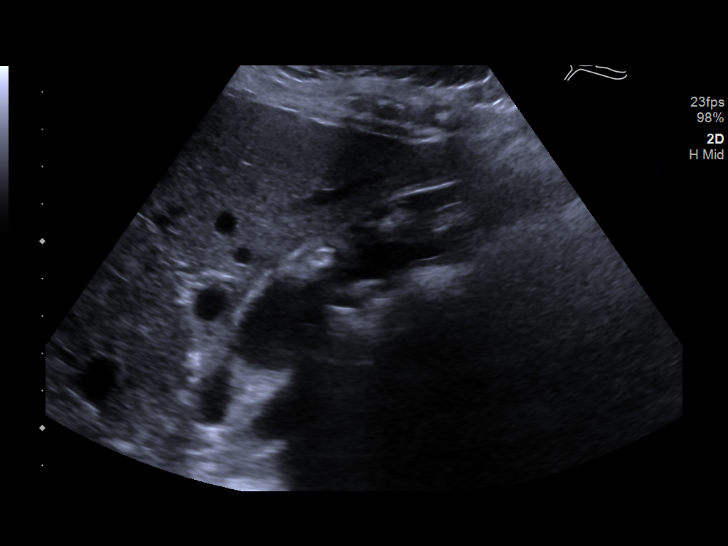
[im 18/48]
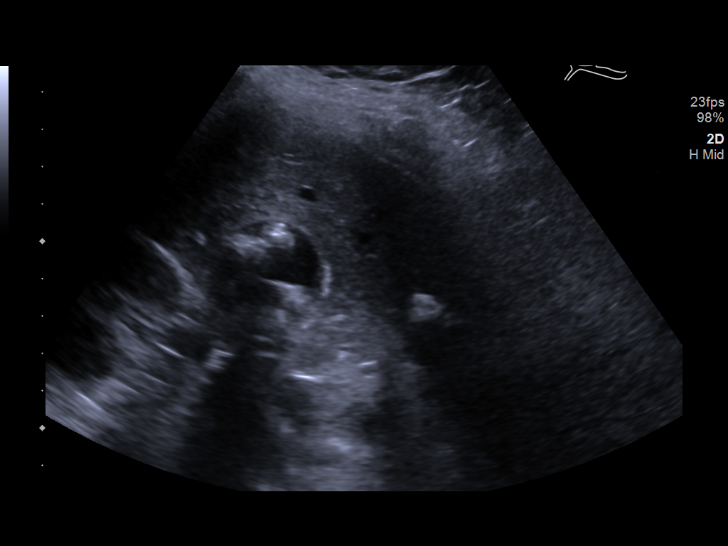
[im 22/48]
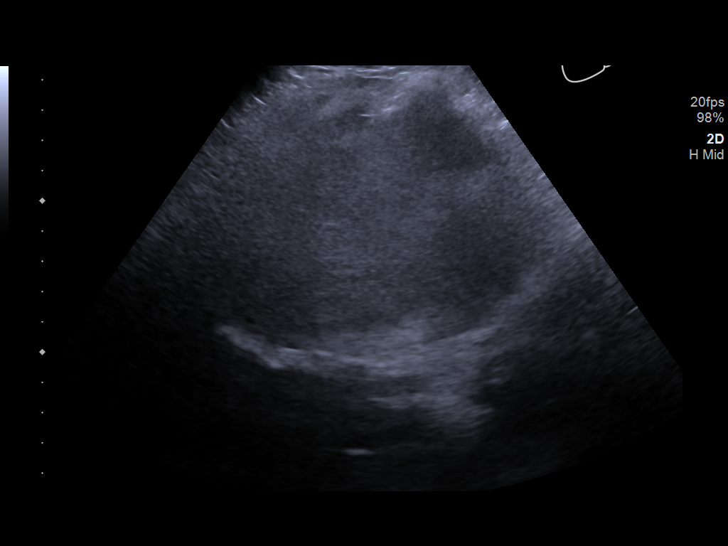
[im 26/48]
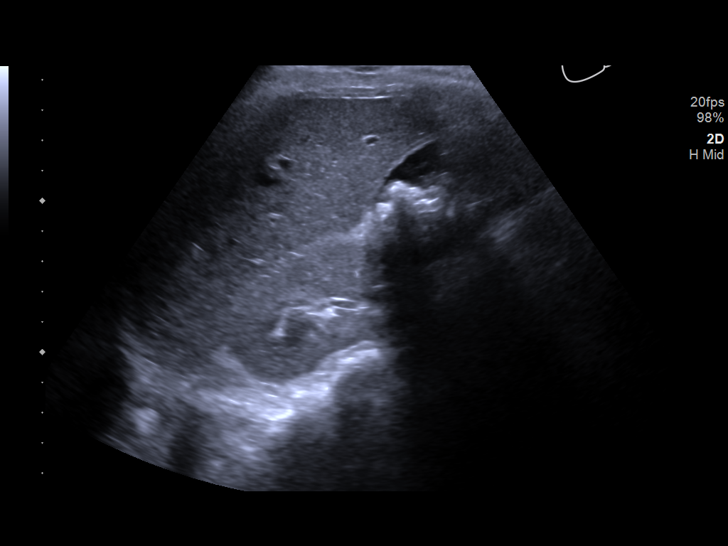
[im 30/48]
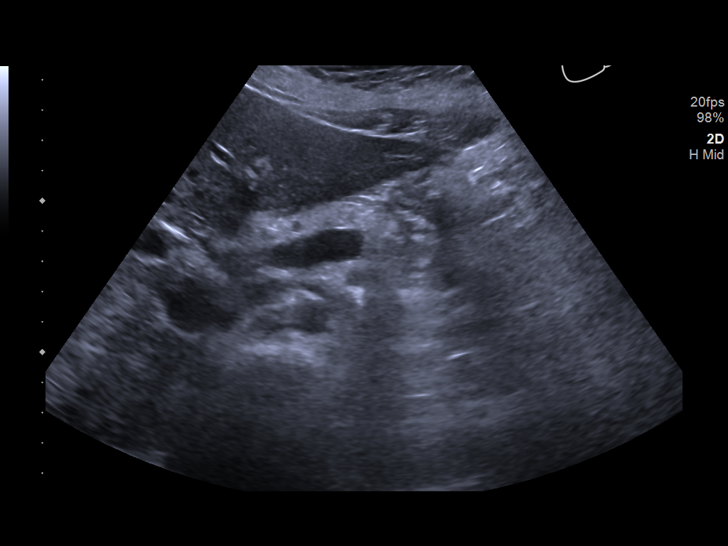
[im 32/48]
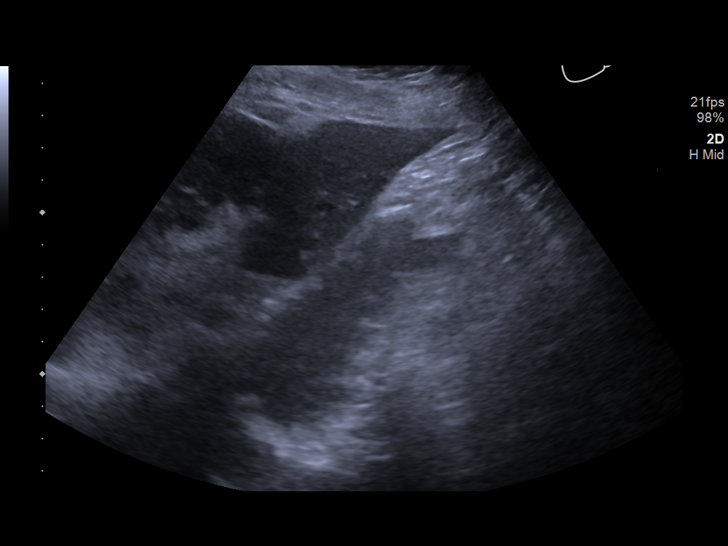
[im 36/48]
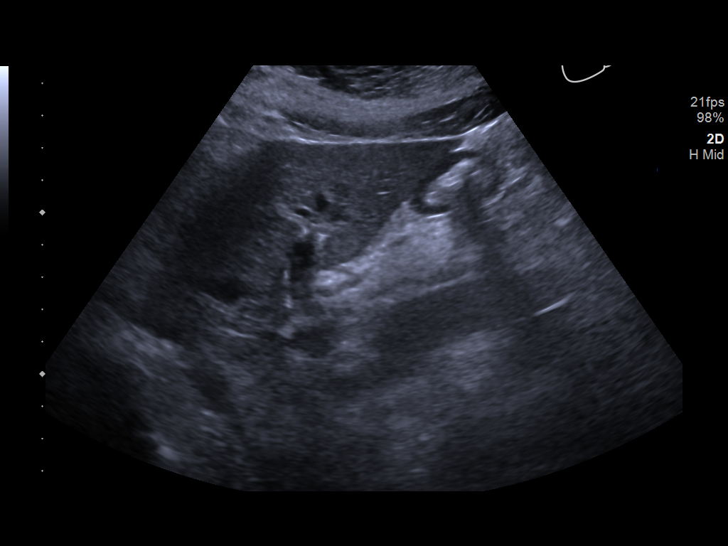
[im 40/48]
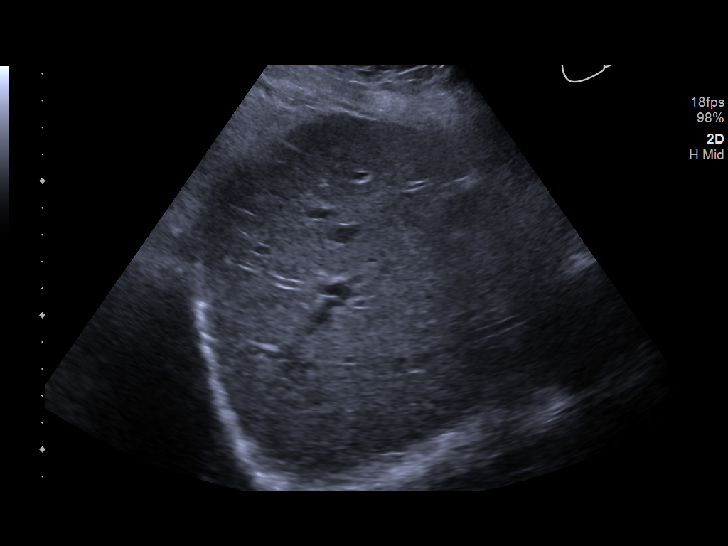
[im 44/48]
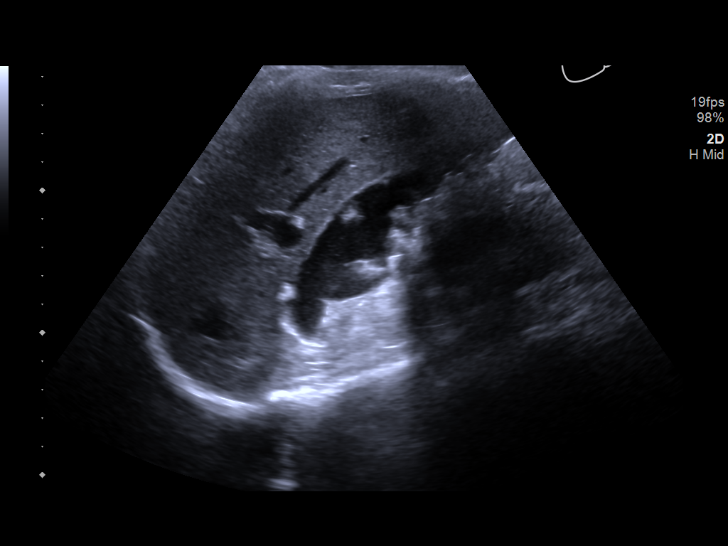
[im 48/48]
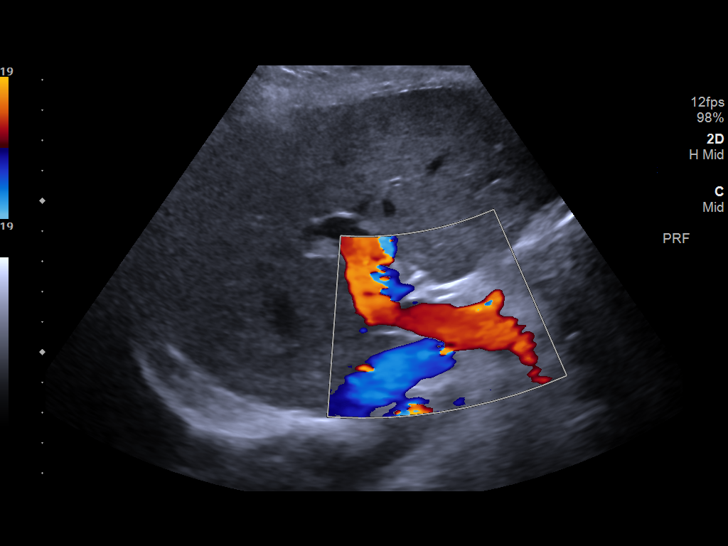

[14 of 25 positions shown; findings below may reference images not displayed]

FINDINGS: Gallbladder:

There are numerous stones in the gallbladder. Gallbladder wall is
not thickened. Negative sonographic Murphy's sign.

Common bile duct:

Diameter: 3.9 mm, normal.

Liver:

No focal lesion identified. Within normal limits in parenchymal
echogenicity. Portal vein is patent on color Doppler imaging with
normal direction of blood flow towards the liver.
IMPRESSION: Extensive cholelithiasis.

## 2021-04-06 LAB — GLUCOSE, POCT (MANUAL RESULT ENTRY): POC Glucose: 107 mg/dl — AB (ref 70–99)

## 2021-04-08 ENCOUNTER — Encounter: Payer: Self-pay | Admitting: Pediatric Intensive Care

## 2021-04-08 DIAGNOSIS — Z139 Encounter for screening, unspecified: Secondary | ICD-10-CM

## 2021-04-08 NOTE — Congregational Nurse Program (Signed)
°  Dept: [phone]   Congregational Nurse Program Note  Date of Encounter: 04/08/2021  Past Medical History: Past Medical History:  Diagnosis Date   Anxiety    Depression     Encounter Details: Initial encounter with client. Interpretation with [facility] ID [id number]. Client would like BP/BG check. No endorsement of diabetes or hypertension. Follow up in clinic as needed. Lisette Abu BSN RN CCNP [phone]
